# Patient Record
Sex: Male | Born: 1935 | Race: White | Hispanic: No | Marital: Married | Smoking: Never smoker
Health system: Southern US, Community
[De-identification: ages and names within clinical notes are randomized; demographics above are authoritative.]

## PROBLEM LIST (undated history)

## (undated) DIAGNOSIS — I10 Essential (primary) hypertension: Secondary | ICD-10-CM

## (undated) DIAGNOSIS — N400 Enlarged prostate without lower urinary tract symptoms: Secondary | ICD-10-CM

## (undated) DIAGNOSIS — I2510 Atherosclerotic heart disease of native coronary artery without angina pectoris: Secondary | ICD-10-CM

## (undated) DIAGNOSIS — I2699 Other pulmonary embolism without acute cor pulmonale: Secondary | ICD-10-CM

## (undated) DIAGNOSIS — M353 Polymyalgia rheumatica: Secondary | ICD-10-CM

## (undated) DIAGNOSIS — R9409 Abnormal results of other function studies of central nervous system: Secondary | ICD-10-CM

## (undated) DIAGNOSIS — L719 Rosacea, unspecified: Secondary | ICD-10-CM

## (undated) HISTORY — PX: APPENDECTOMY: SHX54

## (undated) HISTORY — DX: Other pulmonary embolism without acute cor pulmonale: I26.99

---

## 2005-06-16 ENCOUNTER — Ambulatory Visit: Payer: Self-pay | Admitting: Unknown Physician Specialty

## 2010-02-08 ENCOUNTER — Inpatient Hospital Stay: Payer: Self-pay | Admitting: Internal Medicine

## 2010-03-05 ENCOUNTER — Encounter: Payer: Self-pay | Admitting: Internal Medicine

## 2010-03-22 ENCOUNTER — Encounter: Payer: Self-pay | Admitting: Internal Medicine

## 2010-04-22 ENCOUNTER — Encounter: Payer: Self-pay | Admitting: Internal Medicine

## 2013-06-11 DIAGNOSIS — D239 Other benign neoplasm of skin, unspecified: Secondary | ICD-10-CM

## 2013-06-11 HISTORY — DX: Other benign neoplasm of skin, unspecified: D23.9

## 2015-06-19 ENCOUNTER — Inpatient Hospital Stay: Payer: Medicare Other

## 2015-06-19 ENCOUNTER — Emergency Department: Payer: Medicare Other

## 2015-06-19 ENCOUNTER — Inpatient Hospital Stay
Admission: EM | Admit: 2015-06-19 | Discharge: 2015-06-24 | DRG: 175 | Disposition: A | Discharge status: Home / Self Care | Payer: Medicare Other | Attending: Internal Medicine | Admitting: Internal Medicine

## 2015-06-19 ENCOUNTER — Inpatient Hospital Stay
Admit: 2015-06-19 | Discharge: 2015-06-19 | Disposition: A | Discharge status: Home / Self Care | Payer: Medicare Other | Attending: Internal Medicine | Admitting: Internal Medicine

## 2015-06-19 ENCOUNTER — Encounter: Payer: Self-pay | Admitting: *Deleted

## 2015-06-19 DIAGNOSIS — I2699 Other pulmonary embolism without acute cor pulmonale: Secondary | ICD-10-CM | POA: Diagnosis present

## 2015-06-19 DIAGNOSIS — I824Z1 Acute embolism and thrombosis of unspecified deep veins of right distal lower extremity: Secondary | ICD-10-CM | POA: Diagnosis present

## 2015-06-19 DIAGNOSIS — T380X5A Adverse effect of glucocorticoids and synthetic analogues, initial encounter: Secondary | ICD-10-CM | POA: Diagnosis present

## 2015-06-19 DIAGNOSIS — Z79899 Other long term (current) drug therapy: Secondary | ICD-10-CM | POA: Diagnosis not present

## 2015-06-19 DIAGNOSIS — N400 Enlarged prostate without lower urinary tract symptoms: Secondary | ICD-10-CM | POA: Diagnosis present

## 2015-06-19 DIAGNOSIS — I82409 Acute embolism and thrombosis of unspecified deep veins of unspecified lower extremity: Secondary | ICD-10-CM | POA: Diagnosis not present

## 2015-06-19 DIAGNOSIS — Z789 Other specified health status: Secondary | ICD-10-CM

## 2015-06-19 DIAGNOSIS — I517 Cardiomegaly: Secondary | ICD-10-CM | POA: Diagnosis present

## 2015-06-19 DIAGNOSIS — Z7982 Long term (current) use of aspirin: Secondary | ICD-10-CM | POA: Diagnosis not present

## 2015-06-19 DIAGNOSIS — I10 Essential (primary) hypertension: Secondary | ICD-10-CM | POA: Diagnosis present

## 2015-06-19 DIAGNOSIS — J189 Pneumonia, unspecified organism: Secondary | ICD-10-CM | POA: Diagnosis present

## 2015-06-19 DIAGNOSIS — Z888 Allergy status to other drugs, medicaments and biological substances status: Secondary | ICD-10-CM

## 2015-06-19 DIAGNOSIS — D72829 Elevated white blood cell count, unspecified: Secondary | ICD-10-CM | POA: Diagnosis present

## 2015-06-19 DIAGNOSIS — A419 Sepsis, unspecified organism: Secondary | ICD-10-CM

## 2015-06-19 DIAGNOSIS — R0902 Hypoxemia: Secondary | ICD-10-CM | POA: Diagnosis present

## 2015-06-19 DIAGNOSIS — R0602 Shortness of breath: Secondary | ICD-10-CM | POA: Diagnosis not present

## 2015-06-19 DIAGNOSIS — I2510 Atherosclerotic heart disease of native coronary artery without angina pectoris: Secondary | ICD-10-CM | POA: Diagnosis present

## 2015-06-19 DIAGNOSIS — L719 Rosacea, unspecified: Secondary | ICD-10-CM | POA: Diagnosis present

## 2015-06-19 DIAGNOSIS — R9409 Abnormal results of other function studies of central nervous system: Secondary | ICD-10-CM | POA: Insufficient documentation

## 2015-06-19 DIAGNOSIS — M353 Polymyalgia rheumatica: Secondary | ICD-10-CM | POA: Diagnosis present

## 2015-06-19 DIAGNOSIS — R5383 Other fatigue: Secondary | ICD-10-CM | POA: Diagnosis not present

## 2015-06-19 HISTORY — DX: Benign prostatic hyperplasia without lower urinary tract symptoms: N40.0

## 2015-06-19 HISTORY — DX: Polymyalgia rheumatica: M35.3

## 2015-06-19 HISTORY — DX: Atherosclerotic heart disease of native coronary artery without angina pectoris: I25.10

## 2015-06-19 HISTORY — DX: Abnormal results of other function studies of central nervous system: R94.09

## 2015-06-19 HISTORY — DX: Rosacea, unspecified: L71.9

## 2015-06-19 HISTORY — DX: Essential (primary) hypertension: I10

## 2015-06-19 LAB — BASIC METABOLIC PANEL
Anion gap: 10 (ref 5–15)
BUN: 17 mg/dL (ref 6–20)
CALCIUM: 8.7 mg/dL — AB (ref 8.9–10.3)
CO2: 25 mmol/L (ref 22–32)
Chloride: 101 mmol/L (ref 101–111)
Creatinine, Ser: 1.2 mg/dL (ref 0.61–1.24)
GFR calc Af Amer: >60 mL/min (ref >60)
GFR calc non Af Amer: 57 mL/min — ABNORMAL LOW (ref >60)
Glucose, Bld: 121 mg/dL — ABNORMAL HIGH (ref 65–99)
POTASSIUM: 3.9 mmol/L (ref 3.5–5.1)
Sodium: 136 mmol/L (ref 135–145)

## 2015-06-19 LAB — CBC
HCT: 44.9 % (ref 40.0–52.0)
Hemoglobin: 14.6 g/dL (ref 13.0–18.0)
MCH: 29.8 pg (ref 26.0–34.0)
MCHC: 32.5 g/dL (ref 32.0–36.0)
MCV: 91.7 fL (ref 80.0–100.0)
Platelets: 180 10*3/uL (ref 150–440)
RBC: 4.9 MIL/uL (ref 4.40–5.90)
RDW: 13.5 % (ref 11.5–14.5)
WBC: 15.8 10*3/uL — AB (ref 3.8–10.6)

## 2015-06-19 LAB — BRAIN NATRIURETIC PEPTIDE: B NATRIURETIC PEPTIDE 5: 168 pg/mL — AB (ref 0.0–100.0)

## 2015-06-19 LAB — LACTIC ACID, PLASMA: LACTIC ACID, VENOUS: 0.8 mmol/L (ref 0.5–2.0)

## 2015-06-19 MED ORDER — BISACODYL 5 MG PO TBEC: 5.0000 mg | DELAYED_RELEASE_TABLET | Freq: Every day | ORAL | Status: DC | PRN

## 2015-06-19 MED ORDER — PIPERACILLIN-TAZOBACTAM 3.375 G IVPB
3.3750 g | Freq: Three times a day (TID) | INTRAVENOUS | Status: DC
Start: 2015-06-19 — End: 2015-06-19
  Filled 2015-06-19 (×4): qty 50

## 2015-06-19 MED ORDER — ASPIRIN EC 81 MG PO TBEC
81.0000 mg | DELAYED_RELEASE_TABLET | Freq: Every day | ORAL | Status: DC
  Administered 2015-06-19 – 2015-06-24 (×6): 81 mg via ORAL
  Filled 2015-06-19 (×6): qty 1

## 2015-06-19 MED ORDER — AZITHROMYCIN 250 MG PO TABS
500.0000 mg | ORAL_TABLET | Freq: Every day | ORAL | Status: DC
  Administered 2015-06-19 – 2015-06-24 (×6): 500 mg via ORAL
  Filled 2015-06-19 (×6): qty 2

## 2015-06-19 MED ORDER — PIPERACILLIN-TAZOBACTAM 3.375 G IVPB 30 MIN
3.3750 g | Freq: Once | INTRAVENOUS | Status: DC
  Administered 2015-06-19: 3.375 g via INTRAVENOUS

## 2015-06-19 MED ORDER — DOXYCYCLINE HYCLATE 100 MG PO TABS
50.0000 mg | ORAL_TABLET | Freq: Every day | ORAL | Status: DC
  Administered 2015-06-19 – 2015-06-23 (×5): 50 mg via ORAL
  Filled 2015-06-19 (×5): qty 1

## 2015-06-19 MED ORDER — DOXAZOSIN MESYLATE 2 MG PO TABS
2.0000 mg | ORAL_TABLET | Freq: Every day | ORAL | Status: DC
  Administered 2015-06-19 – 2015-06-23 (×5): 2 mg via ORAL
  Filled 2015-06-19 (×5): qty 1

## 2015-06-19 MED ORDER — ACETAMINOPHEN 650 MG RE SUPP: 650.0000 mg | Freq: Four times a day (QID) | RECTAL | Status: DC | PRN

## 2015-06-19 MED ORDER — GUAIFENESIN-DM 100-10 MG/5ML PO SYRP
5.0000 mL | ORAL_SOLUTION | ORAL | Status: DC | PRN
  Administered 2015-06-21 – 2015-06-22 (×6): 5 mL via ORAL
  Filled 2015-06-19 (×6): qty 5

## 2015-06-19 MED ORDER — PIPERACILLIN-TAZOBACTAM 3.375 G IVPB: 3.3750 g | Freq: Once | INTRAVENOUS | Status: DC

## 2015-06-19 MED ORDER — DOXYCYCLINE HYCLATE 50 MG PO CAPS
50.0000 mg | ORAL_CAPSULE | Freq: Every day | ORAL | Status: DC
  Filled 2015-06-19: qty 1

## 2015-06-19 MED ORDER — PREDNISONE 2.5 MG PO TABS
2.5000 mg | ORAL_TABLET | Freq: Two times a day (BID) | ORAL | Status: DC
  Filled 2015-06-19: qty 2

## 2015-06-19 MED ORDER — LEVOFLOXACIN IN D5W 750 MG/150ML IV SOLN
750.0000 mg | Freq: Once | INTRAVENOUS | Status: DC
  Administered 2015-06-19: 750 mg via INTRAVENOUS
  Filled 2015-06-19: qty 150

## 2015-06-19 MED ORDER — SIMVASTATIN 40 MG PO TABS
40.0000 mg | ORAL_TABLET | Freq: Every day | ORAL | Status: DC
  Administered 2015-06-19 – 2015-06-23 (×5): 40 mg via ORAL
  Filled 2015-06-19 (×5): qty 1

## 2015-06-19 MED ORDER — VANCOMYCIN HCL 10 G IV SOLR: 1500.0000 mg | Freq: Once | INTRAVENOUS | Status: DC

## 2015-06-19 MED ORDER — ONDANSETRON HCL 4 MG PO TABS: 4.0000 mg | ORAL_TABLET | Freq: Four times a day (QID) | ORAL | Status: DC | PRN

## 2015-06-19 MED ORDER — OMEGA-3-ACID ETHYL ESTERS 1 G PO CAPS
1.0000 g | ORAL_CAPSULE | Freq: Two times a day (BID) | ORAL | Status: DC
  Administered 2015-06-19 – 2015-06-24 (×11): 1 g via ORAL
  Filled 2015-06-19 (×11): qty 1

## 2015-06-19 MED ORDER — SODIUM CHLORIDE 0.9 % IV BOLUS (SEPSIS)
1000.0000 mL | Freq: Once | INTRAVENOUS | Status: AC
  Administered 2015-06-19: 1000 mL via INTRAVENOUS

## 2015-06-19 MED ORDER — ACETAMINOPHEN 325 MG PO TABS
650.0000 mg | ORAL_TABLET | Freq: Four times a day (QID) | ORAL | Status: DC | PRN
  Administered 2015-06-20: 650 mg via ORAL
  Filled 2015-06-19: qty 2

## 2015-06-19 MED ORDER — ENOXAPARIN SODIUM 40 MG/0.4ML ~~LOC~~ SOLN: 40.0000 mg | SUBCUTANEOUS | Status: DC

## 2015-06-19 MED ORDER — ENOXAPARIN SODIUM 100 MG/ML ~~LOC~~ SOLN
1.0000 mg/kg | Freq: Two times a day (BID) | SUBCUTANEOUS | Status: DC
  Administered 2015-06-19 – 2015-06-21 (×4): 95 mg via SUBCUTANEOUS
  Filled 2015-06-19 (×8): qty 1

## 2015-06-19 MED ORDER — PREDNISONE 10 MG PO TABS
5.0000 mg | ORAL_TABLET | Freq: Every day | ORAL | Status: DC
  Administered 2015-06-20 – 2015-06-24 (×5): 5 mg via ORAL
  Filled 2015-06-19: qty 2
  Filled 2015-06-19 (×5): qty 1

## 2015-06-19 MED ORDER — PREDNISONE 2.5 MG PO TABS
2.5000 mg | ORAL_TABLET | Freq: Every evening | ORAL | Status: DC
Start: 2015-06-19 — End: 2015-06-24
  Administered 2015-06-20 – 2015-06-23 (×4): 2.5 mg via ORAL
  Filled 2015-06-19 (×7): qty 1

## 2015-06-19 MED ORDER — IOHEXOL 350 MG/ML SOLN
100.0000 mL | Freq: Once | INTRAVENOUS | Status: AC | PRN
  Administered 2015-06-19: 100 mL via INTRAVENOUS

## 2015-06-19 MED ORDER — ONDANSETRON HCL 4 MG/2ML IJ SOLN: 4.0000 mg | Freq: Four times a day (QID) | INTRAMUSCULAR | Status: DC | PRN

## 2015-06-19 MED ORDER — IPRATROPIUM-ALBUTEROL 0.5-2.5 (3) MG/3ML IN SOLN: 3.0000 mL | RESPIRATORY_TRACT | Status: DC | PRN

## 2015-06-19 NOTE — Progress Notes (Signed)
ANTICOAGULATION CONSULT NOTE - Initial Consult  Pharmacy Consult for enoxaparin Indication: pulmonary embolus  Allergies  Allergen Reactions  . Beta Adrenergic Blockers Other (See Comments)    Reaction:  Drops pts pulse rate  . Cozaar [Losartan Potassium] Other (See Comments)    Reaction:  Cramps   . Indomethacin Other (See Comments)    Reaction:  Bleeding     Patient Measurements: Height: 5\' 8"  (172.7 cm) Weight: 208 lb (94.348 kg) IBW/kg (Calculated) : 68.4   Vital Signs: Temp: 99.4 F (37.4 C) (07/28 1210) Temp Source: Oral (07/28 1210) BP: 128/82 mmHg (07/28 1430) Pulse Rate: 110 (07/28 1430)  Labs:  Recent Labs  06/19/15 1235  HGB 14.6  HCT 44.9  PLT 180  CREATININE 1.20    Estimated Creatinine Clearance: 57.5 mL/min (by C-G formula based on Cr of 1.2).   Medical History: Past Medical History  Diagnosis Date  . Coronary artery disease   . Hypertension   . Other abnormality of brain or central nervous system function study     poly \\rheumatic  myalgia  . BPH (benign prostatic hyperplasia)   . Polymyalgia rheumatica   . Rosacea      Assessment: 79 yo male with PE starting on enoxaparin Hgb 14.6, Plt 180  Goal of Therapy:  Anti-Xa level 0.6-1 units/ml 4hrs after LMWH dose given Monitor platelets by anticoagulation protocol: Yes   Plan:  Will continue MD orders for enoxaparin 95 mg SQ q12h (1 mg /kg) Will need to continue to monitor SCr, Hgb and Plt  Rayna Sexton L 06/19/2015,3:17 PM

## 2015-06-19 NOTE — Progress Notes (Signed)
Spoke with Dr Lavetta Nielsen. Reported Tele- ST 120s-130. Pulse ox 87-88% on BNC 5L.Ok to place on 6 L.

## 2015-06-19 NOTE — Progress Notes (Signed)
ANTIBIOTIC CONSULT NOTE - INITIAL  Pharmacy Consult for ZOSYN Indication: pneumonia/?HCAP  Allergies  Allergen Reactions  . Beta Adrenergic Blockers Other (See Comments)    Reaction:  Drops pts pulse rate  . Cozaar [Losartan Potassium] Other (See Comments)    Reaction:  Cramps   . Indomethacin Other (See Comments)    Reaction:  Bleeding     Patient Measurements: Height: 5\' 8"  (172.7 cm) Weight: 208 lb (94.348 kg) IBW/kg (Calculated) : 68.4 Adjusted Body Weight:   Vital Signs: Temp: 99.4 F (37.4 C) (07/28 1210) Temp Source: Oral (07/28 1210) BP: 124/89 mmHg (07/28 1400) Pulse Rate: 104 (07/28 1400) Intake/Output from previous day:   Intake/Output from this shift:    Labs:  Recent Labs  06/19/15 1235  WBC 15.8*  HGB 14.6  PLT 180  CREATININE 1.20   Estimated Creatinine Clearance: 57.5 mL/min (by C-G formula based on Cr of 1.2).   Microbiology: No results found for this or any previous visit (from the past 720 hour(s)).  Medical History: Past Medical History  Diagnosis Date  . Coronary artery disease   . Hypertension   . Other abnormality of brain or central nervous system function study     poly \\rheumatic  myalgia  . BPH (benign prostatic hyperplasia)   . Polymyalgia rheumatica   . Rosacea     Medications:  Scheduled:  . aspirin EC  81 mg Oral Daily  . azithromycin  500 mg Oral Daily  . enoxaparin (LOVENOX) injection  40 mg Subcutaneous Q24H   Anti-infectives    Start     Dose/Rate Route Frequency Ordered Stop   06/19/15 1445  piperacillin-tazobactam (ZOSYN) IVPB 3.375 g     3.375 g 12.5 mL/hr over 240 Minutes Intravenous 3 times per day 06/19/15 1436     06/19/15 1430  azithromycin (ZITHROMAX) tablet 500 mg     500 mg Oral Daily 06/19/15 1415     06/19/15 1300  levofloxacin (LEVAQUIN) IVPB 750 mg  Status:  Discontinued     750 mg 100 mL/hr over 90 Minutes Intravenous  Once 06/19/15 1258 06/19/15 1415   06/19/15 1300  vancomycin (VANCOCIN)  1,500 mg in sodium chloride 0.9 % 500 mL IVPB  Status:  Discontinued     1,500 mg 250 mL/hr over 120 Minutes Intravenous  Once 06/19/15 1258 06/19/15 1415   06/19/15 1300  piperacillin-tazobactam (ZOSYN) IVPB 3.375 g     3.375 g 12.5 mL/hr over 240 Minutes Intravenous  Once 06/19/15 1258       Assessment: Patient presents with outpatient treatment failure of community acquired pneumonia with tachycardia and acute hypoxic respiratory failure. Patient has Sirs criteria andpossible sepsis with healthcare associated pneumonia.  IV Zosyn and vancomycin and Levaquin ordered initially.  Plan:  Will start Zosyn EI 3.375gm IV q8h.  Chinita Greenland PharmD Clinical Pharmacist 06/19/2015

## 2015-06-19 NOTE — ED Notes (Signed)
Seen at clinic last week, placed on antibiotics and couigh meds  Dx with upper resp infection, states sob is worse

## 2015-06-19 NOTE — Progress Notes (Signed)
*  PRELIMINARY RESULTS* Echocardiogram 2D Echocardiogram has been performed.  Isaac Gould Stills 06/19/2015, 5:22 PM

## 2015-06-19 NOTE — H&P (Signed)
[place] [facility] [place] NAME: Isaac Gould    MR#:  [id number]  DATE OF BIRTH:  1936/06/03  DATE OF ADMISSION:  06/19/2015  PRIMARY CARE PHYSICIAN: No primary care provider on file.   REQUESTING/REFERRING PHYSICIAN: Dr. Joni Fears - ED  CHIEF COMPLAINT:   Chief Complaint  Patient presents with  . Shortness of Breath    HISTORY OF PRESENT ILLNESS:  Isaac Gould  is a 79 y.o. male with a known history of hypertension, CAD, rosacea on chronic doxycycline presents to the emergency room with worsening shortness of breath and cough with green sputum. Patient was seen by his primary care physician and treated for mild pneumonia with cefuroxime times as outpatient. Patient has taken his antibiotics for 8 days today and with worsening symptoms present to the emergency room. His oxygen saturations were noted to be 87% on room air and a chest x-ray showing bilateral mild pneumonitis with a small left pleural effusion. A CT scan of the chest has been ordered and is pending. Being admitted as inpatient for IV antibiotics for failed outpatient therapy of pneumonia. No chest pain, fever, nausea, vomiting, abdominal pain, dysuria, diarrhea. No orthopnea. No edema. No sick contacts.  PAST MEDICAL HISTORY:   Past Medical History  Diagnosis Date  . Coronary artery disease   . Hypertension   . Other abnormality of brain or central nervous system function study     poly \\rheumatic  myalgia  . BPH (benign prostatic hyperplasia)   . Polymyalgia rheumatica   . Rosacea     PAST SURGICAL HISTORY:   Past Surgical History  Procedure Laterality Date  . Appendectomy      SOCIAL HISTORY:   History  Substance Use Topics  . Smoking status: Never Smoker   . Smokeless tobacco: Not on file  . Alcohol Use: No    FAMILY HISTORY:  History reviewed. No pertinent family history.  DRUG ALLERGIES:   Allergies  Allergen Reactions  . Beta Adrenergic Blockers      REVIEW OF SYSTEMS:   Review of Systems  Constitutional: Positive for chills and malaise/fatigue. Negative for fever and weight loss.  HENT: Positive for congestion. Negative for hearing loss and nosebleeds.   Eyes: Negative for blurred vision, double vision and pain.  Respiratory: Positive for cough, sputum production and shortness of breath. Negative for hemoptysis and wheezing.   Cardiovascular: Negative for chest pain, palpitations, orthopnea and leg swelling.  Gastrointestinal: Negative for nausea, vomiting, abdominal pain, diarrhea and constipation.  Genitourinary: Negative for dysuria and hematuria.  Musculoskeletal: Negative for myalgias, back pain and falls.  Skin: Negative for rash.  Neurological: Positive for weakness. Negative for dizziness, tremors, sensory change, speech change, focal weakness, seizures and headaches.  Endo/Heme/Allergies: Does not bruise/bleed easily.  Psychiatric/Behavioral: Negative for depression and memory loss. The patient is not nervous/anxious.     MEDICATIONS AT HOME:   Prior to Admission medications   Not on File      aspirin 81 MG EC tablet Take 81 mg by mouth once daily.     Active  omega-3 fatty acids/fish oil 340-1,000 mg capsule Take 1 capsule by mouth 2 (two) times daily.     Active  MULTIVITAMIN/IRON/FOLIC ACID (CENTRUM COMPLETE ORAL) Take 1 tablet by mouth once daily.     Active  doxycycline (MONODOX) 50 MG capsule Take 50 mg by mouth once daily.     Active  doxazosin (CARDURA) 2 MG tablet Take 1 tablet (2 mg  total) by mouth nightly. 90 tablet  3 05/07/2015  Active  candesartan (ATACAND) 4 MG tablet Take 1 tablet (4 mg total) by mouth once daily. 90 tablet  3 05/07/2015  Active  simvastatin (ZOCOR) 40 MG tablet Take 1 tablet (40 mg total) by mouth nightly. 90 tablet  3 05/07/2015  Active  hydrocodone-homatropine (HYCODAN) 5-1.5 mg/5 mL syrup Take 5 mLs by mouth every 6 (six) hours as needed for Cough. 120 mL  0  06/11/2015  Active  cefUROXime (CEFTIN) 250 MG tablet Take 1 tablet (250 mg total) by mouth 2 (two) times daily. 20 tablet  0 06/11/2015  Active     VITAL SIGNS:  Blood pressure 127/111, pulse 105, temperature 99.4 F (37.4 C), temperature source Oral, resp. rate 24, height 5\' 8"  (1.727 m), weight 94.348 kg (208 lb), SpO2 93 %.  PHYSICAL EXAMINATION:  Physical Exam  GENERAL:  79 y.o.-year-old patient lying in the bed with no acute distress.  EYES: Pupils equal, round, reactive to light and accommodation. No scleral icterus. Extraocular muscles intact.  HEENT: Head atraumatic, normocephalic. Oropharynx and nasopharynx clear. No oropharyngeal erythema, moist oral mucosa  NECK:  Supple, no jugular venous distention. No thyroid enlargement, no tenderness.  LUNGS:  no wheezing, rales. No use of accessory muscles of respiration. Right ronchi. CARDIOVASCULAR: S1, S2 normal. No murmurs, rubs, or gallops.  ABDOMEN: Soft, nontender, nondistended. Bowel sounds present. No organomegaly or mass.  EXTREMITIES: No pedal edema, cyanosis, or clubbing. + 2 pedal & radial pulses b/l.   NEUROLOGIC: Cranial nerves II through XII are intact. No focal Motor or sensory deficits appreciated b/l PSYCHIATRIC: The patient is alert and oriented x 3. Good affect.  SKIN: No obvious rash, lesion, or ulcer.   LABORATORY PANEL:   CBC  Recent Labs Lab 06/19/15 1235  WBC 15.8*  HGB 14.6  HCT 44.9  PLT 180   ------------------------------------------------------------------------------------------------------------------  Chemistries   Recent Labs Lab 06/19/15 1235  NA 136  K 3.9  CL 101  CO2 25  GLUCOSE 121*  BUN 17  CREATININE 1.20  CALCIUM 8.7*   ------------------------------------------------------------------------------------------------------------------  Cardiac Enzymes No results for input(s): TROPONINI in the last 168  hours. ------------------------------------------------------------------------------------------------------------------  RADIOLOGY:  Dg Chest 2 View  06/19/2015   CLINICAL DATA:  Coughing for 2 weeks, productive sputum.  EXAM: CHEST  2 VIEW  COMPARISON:  Chest x-ray dated 02/08/2010.  FINDINGS: Cardiomegaly is stable. Overall cardiomediastinal silhouette is unchanged in size or configuration. Suspect age-related aortic ectasia. There is mild scarring/ fibrosis within each lung. Also noted is central pulmonary vascular congestion and probable mild bilateral interstitial edema which appears similar to the previous exam. There is a small left pleural effusion which appears to be new.  Degenerative changes and ankylosis are seen throughout the scoliotic thoracic spine. No acute osseous abnormality.  IMPRESSION: 1. Cardiomegaly, stable. 2. Central pulmonary vascular congestion and mild bilateral interstitial edema, likely on a background of mild chronic scarring/fibrosis, suggests mild volume overload/ congestive heart failure. Appearance is stable compared to a previous chest x-ray dated 02/08/2010 indicating a chronic versus recurrent mild congestive heart failure. 3. Small left pleural effusion.   Electronically Signed   By: Franki Cabot M.D.   On: 06/19/2015 13:41     IMPRESSION AND PLAN:   * Bilateral community-acquired pneumonia with acute respiratory failure and sepsis. Failed outpatient antibiotics therapy. Start IV Zosyn and azithromycin. Send for sputum and blood cultures. CT scan of the chest is pending. Lactic acid pending. IV fluids.  Oxygen to keep saturations over 90%. DuoNeb when necessary.  * Coronary artery disease stable  * Hypertension Continue home medications.  * DVT prophylaxis with Lovenox.   All the records are reviewed and case discussed with ED provider. Management plans discussed with the patient, family and they are in agreement.  CODE STATUS: FULL  TOTAL TIME  TAKING CARE OF THIS PATIENT: 40 minutes.    Hillary Bow R M.D on 06/19/2015 at 2:16 PM  Between 7am to 6pm - Pager - [phone]  After 6pm go to [email or web address] - password [email or web address]  [facility]  Office  [phone]  CC: Primary care physician; No primary care provider on file.

## 2015-06-19 NOTE — Progress Notes (Signed)
After patient was admitted his CT scan of the chest showed bilateral submassive pulmonary embolism with right heart strain. Case was discussed with pulmonary critical care at [facility] for possible TPA or intra-arterial thrombolysis and considering patient's hemodynamics are stable this was not advised.  We'll admit patient here at [facility]. Telemetry monitoring. Lovenox therapeutic dose 2 times a day. Check echocardiogram. We will also order lower extremity Dopplers. This is unprovoked pulmonary embolism and he will need hematology follow-up as outpatient for hypercoagulable workup.  BP - 117/73 and HR 110 Saturations - 94% on 3 L O2

## 2015-06-19 NOTE — ED Provider Notes (Addendum)
[facility] Emergency Department Provider Note  ____________________________________________  Time seen: 12:30 PM  I have reviewed the triage vital signs and the nursing notes.   HISTORY  Chief Complaint Shortness of Breath    HPI Isaac Gould is a 79 y.o. male who presents with shortness of breath for the past 10 days. He is also having it cough that is productive of thick yellow sputum. He notes that the shortness of breath is really worse with walking.  Patient also reports fever and chills. He has been taking Ceftin as prescribed by primary care for the past week without improvement. Denies syncope. Denies chest pain except with coughing.     Past Medical History  Diagnosis Date  . Coronary artery disease   . Hypertension   . Other abnormality of brain or central nervous system function study     poly \\rheumatic  myalgia    Patient Active Problem List   Diagnosis Date Noted  . Pneumonia 06/19/2015  . Other abnormality of brain or central nervous system function study     Past Surgical History  Procedure Laterality Date  . Appendectomy      No current outpatient prescriptions on file.  Allergies Beta adrenergic blockers  History reviewed. No pertinent family history.  Social History History  Substance Use Topics  . Smoking status: Never Smoker   . Smokeless tobacco: Not on file  . Alcohol Use: No    Review of Systems  Constitutional: No fever or chills. No weight changes Eyes:No blurry vision or double vision.  ENT: No sore throat. Cardiovascular: Chest pain with coughing as above. Respiratory: Positive shortness of breath and productive cough Gastrointestinal: Negative for abdominal pain, vomiting and diarrhea.  No BRBPR or melena. Genitourinary: Negative for dysuria, urinary retention, bloody urine, or difficulty urinating. Musculoskeletal: Negative for back pain. No joint swelling or pain. Skin: Negative for  rash. Neurological: Negative for headaches, focal weakness or numbness. Psychiatric:No anxiety or depression.   Endocrine:No hot/cold intolerance, changes in energy, or sleep difficulty.  10-point ROS otherwise negative.  ____________________________________________   PHYSICAL EXAM:  VITAL SIGNS: ED Triage Vitals  Enc Vitals Group     BP 06/19/15 1210 125/78 mmHg     Pulse Rate 06/19/15 1210 110     Resp 06/19/15 1210 24     Temp 06/19/15 1210 99.4 F (37.4 C)     Temp Source 06/19/15 1210 Oral     SpO2 06/19/15 1210 90 %     Weight 06/19/15 1210 208 lb (94.348 kg)     Height 06/19/15 1210 5\' 8"  (1.727 m)     Head Cir --      Peak Flow --      Pain Score 06/19/15 1211 8     Pain Loc --      Pain Edu? --      Excl. in [place]? --   87% on room air, improved to 90-92% on 3 L nasal cannula.   Constitutional: Alert and oriented. Mild distress. Eyes: No scleral icterus. No conjunctival pallor. PERRL. EOMI ENT   Head: Normocephalic and atraumatic.   Nose: No congestion/rhinnorhea. No septal hematoma   Mouth/Throat: MMM, no pharyngeal erythema. No peritonsillar mass. No uvula shift.   Neck: No stridor. No SubQ emphysema. No meningismus. Hematological/Lymphatic/Immunilogical: No cervical lymphadenopathy. Cardiovascular: Tachycardia heart rate 110-120. Normal and symmetric distal pulses are present in all extremities. No murmurs, rubs, or gallops. Respiratory: Coarse crackles at right base. Good air entry in all  other fields. No wheezing.. Gastrointestinal: Soft and nontender. No distention. There is no CVA tenderness.  No rebound, rigidity, or guarding. Genitourinary: deferred Musculoskeletal: Nontender with normal range of motion in all extremities. No joint effusions.  No lower extremity tenderness.  No edema. Neurologic:   Normal speech and language.  CN 2-10 normal. Motor grossly intact. No pronator drift.  Normal gait. No gross focal neurologic deficits are  appreciated.  Skin:  Skin is warm, dry and intact. No rash noted.  No petechiae, purpura, or bullae. Psychiatric: Mood and affect are normal. Speech and behavior are normal. Patient exhibits appropriate insight and judgment.  ____________________________________________    LABS (pertinent positives/negatives) (all labs ordered are listed, but only abnormal results are displayed) Labs Reviewed  CBC - Abnormal; Notable for the following:    WBC 15.8 (*)    All other components within normal limits  BASIC METABOLIC PANEL - Abnormal; Notable for the following:    Glucose, Bld 121 (*)    Calcium 8.7 (*)    GFR calc non Af Amer 57 (*)    All other components within normal limits  CULTURE, BLOOD (ROUTINE X 2)  CULTURE, BLOOD (ROUTINE X 2)  CULTURE, EXPECTORATED SPUTUM-ASSESSMENT  LACTIC ACID, PLASMA  LACTIC ACID, PLASMA  BRAIN NATRIURETIC PEPTIDE   ____________________________________________   EKG  Interpreted by me Sinus tachycardia rate 105, normal axis intervals QRS and ST segments and T waves.  ____________________________________________    RADIOLOGY  Chest x-ray shows stable cardiomegaly and pulmonary vascular congestion  ____________________________________________   PROCEDURES CRITICAL CARE Performed by: Joni Fears, Revan Gendron   Total critical care time: 35 minutes  Critical care time was exclusive of separately billable procedures and treating other patients.  Critical care was necessary to treat or prevent imminent or life-threatening deterioration.  Critical care was time spent personally by me on the following activities: development of treatment plan with patient and/or surrogate as well as nursing, discussions with consultants, evaluation of patient's response to treatment, examination of patient, obtaining history from patient or surrogate, ordering and performing treatments and interventions, ordering and review of laboratory studies, ordering and review of  radiographic studies, pulse oximetry and re-evaluation of patient's condition.  ____________________________________________   INITIAL IMPRESSION / ASSESSMENT AND PLAN / ED COURSE  Pertinent labs & imaging results that were available during my care of the patient were reviewed by me and considered in my medical decision making (see chart for details).  Patient presents with outpatient treatment failure of committing acquired pneumonia with tachycardia and acute hypoxic respiratory failure. At this point he now has Sirs criteria and we will treat him as sepsis with healthcare associated pneumonia. We'll give IV fluids while checking labs and a chest x-ray. If x-ray does not show a definite pneumonia I'll proceed with CT angiogram of the chest to ensure that this is not infected PE masquerading as pneumonia. IV Zosyn and vancomycin and Levaquin ordered initially. ----------------------------------------- 2:25 PM on 06/19/2015 ----------------------------------------- Tachycardia improving with IV fluids. Blood pressure remained stable. Oxygenation adequate on nasal cannula. Awaiting CT angiogram of the chest. Discussed with hospitalist Dr. Darvin Neighbours who will arrange for admission for pneumonia. ----------------------------------------- 2:58 PM on 06/19/2015 -----------------------------------------  After initial evaluation by hospitalist and plan for admission, I received a follow-up phone call from radiology indicated and the patient has diffuse pulmonary emboli including a nonobstructive saddle embolus with a total clot burden that is causing right heart strain consistent with subcutaneous mass of pulmonary embolus. [facility] is able to  do catheter directed intra-arterial thrombolysis to relieve this and decrease the patient's long-term comorbidities such as pollen or hypertension, so I discussed this possibility with the patient who is agreeable to transfer to [facility] for evaluation for this  treatment.  ----------------------------------------- 3:08 PM on 06/19/2015 -----------------------------------------  Discussed with Pohlmann or critical care at [facility] who notes that because the patient has not had any episodes of hypotension, they do not think the risk-benefit balance is favorable in this case for the use of thrombolysis. They recommend usual care, so we'll keep the patient here at [facility] for anticoagulation and monitoring. ____________________________________________   FINAL CLINICAL IMPRESSION(S) / ED DIAGNOSES  Final diagnoses:  Failure of outpatient treatment  Sepsis, due to unspecified organism  Pneumonia, organism unspecified      Carrie Mew, MD 06/19/15 [place], MD 06/19/15 [phone]

## 2015-06-20 DIAGNOSIS — I2699 Other pulmonary embolism without acute cor pulmonale: Principal | ICD-10-CM

## 2015-06-20 DIAGNOSIS — I2510 Atherosclerotic heart disease of native coronary artery without angina pectoris: Secondary | ICD-10-CM

## 2015-06-20 DIAGNOSIS — I10 Essential (primary) hypertension: Secondary | ICD-10-CM

## 2015-06-20 DIAGNOSIS — M791 Myalgia: Secondary | ICD-10-CM

## 2015-06-20 DIAGNOSIS — M353 Polymyalgia rheumatica: Secondary | ICD-10-CM

## 2015-06-20 DIAGNOSIS — I82409 Acute embolism and thrombosis of unspecified deep veins of unspecified lower extremity: Secondary | ICD-10-CM

## 2015-06-20 DIAGNOSIS — R0602 Shortness of breath: Secondary | ICD-10-CM

## 2015-06-20 DIAGNOSIS — R5383 Other fatigue: Secondary | ICD-10-CM

## 2015-06-20 DIAGNOSIS — R531 Weakness: Secondary | ICD-10-CM

## 2015-06-20 DIAGNOSIS — N400 Enlarged prostate without lower urinary tract symptoms: Secondary | ICD-10-CM

## 2015-06-20 DIAGNOSIS — L719 Rosacea, unspecified: Secondary | ICD-10-CM

## 2015-06-20 MED ORDER — PNEUMOCOCCAL VAC POLYVALENT 25 MCG/0.5ML IJ INJ: 0.5000 mL | INJECTION | INTRAMUSCULAR | Status: DC

## 2015-06-20 NOTE — Care Management Note (Addendum)
Case Management Note  Patient Details  Name: Isaac Gould MRN: [id number] Date of Birth: [birth year]  Subjective/Objective:                   Met with patient to discuss discharge planning. He denies RNCM needs although O2 is new to this patient. He appears diaphoretic. He states he is not having chest pains but having back pain that he "relates to laying in the bed". He states he has a cough but not nauseated. His wife is at bedside. He states his PCP is Dr. Lisette Grinder III. He is independent with daily activities. He denies needing or ever having home health services.  Action/Plan:   RNCM to follow for home O2 needs; Patient must have a chronic lung dx or CHF in order for O2 to be paid for. Acute illnesses such as PNA is not covered. O2 assessments also needed by RN.   Expected Discharge Date:                  Expected Discharge Plan:     In-House Referral:     Discharge planning Services  CM Consult  Post Acute Care Choice:    Choice offered to:  Patient, Spouse  DME Arranged:    DME Agency:     HH Arranged:    [place] Agency:     Status of Service:     Medicare Important Message Given:    Date Medicare IM Given:    Medicare IM give by:    Date Additional Medicare IM Given:    Additional Medicare Important Message give by:     If discussed at [place] of Stay Meetings, dates discussed:    Additional Comments:  Marshell Garfinkel, RN 06/20/2015, 1:36 PM

## 2015-06-20 NOTE — Plan of Care (Signed)
Problem: Phase I Progression Outcomes Goal: OOB as tolerated unless otherwise ordered Outcome: Progressing BSC

## 2015-06-20 NOTE — Progress Notes (Signed)
ANTICOAGULATION CONSULT NOTE - Follow up [facility] for enoxaparin Indication: pulmonary embolus  Allergies  Allergen Reactions  . Beta Adrenergic Blockers Other (See Comments)    Reaction:  Drops pts pulse rate  . Cozaar [Losartan Potassium] Other (See Comments)    Reaction:  Cramps   . Indomethacin Other (See Comments)    Reaction:  Bleeding     Patient Measurements: Height: 5\' 8"  (172.7 cm) Weight: 209 lb (94.802 kg) IBW/kg (Calculated) : 68.4   Vital Signs: Temp: 98.3 F (36.8 C) (07/29 0727) Temp Source: Oral (07/29 0727) BP: 99/61 mmHg (07/29 0727) Pulse Rate: 103 (07/29 0727)  Labs:  Recent Labs  06/19/15 1235  HGB 14.6  HCT 44.9  PLT 180  CREATININE 1.20    Estimated Creatinine Clearance: 57.6 mL/min (by C-G formula based on Cr of 1.2).   Medical History: Past Medical History  Diagnosis Date  . Coronary artery disease   . Hypertension   . Other abnormality of brain or central nervous system function study     poly \\rheumatic  myalgia  . BPH (benign prostatic hyperplasia)   . Polymyalgia rheumatica   . Rosacea      Assessment: 79 yo male with PE starting on enoxaparin 7/28: Hgb 14.6, Plt 180  Goal of Therapy:  Monitor platelets by anticoagulation protocol: Yes   Plan:  Will continue MD orders for enoxaparin 95 mg SQ q12h (1 mg /kg) Will need to continue to monitor SCr, Hgb and Plt  Chinita Greenland PharmD Clinical Pharmacist 06/20/2015

## 2015-06-20 NOTE — Progress Notes (Signed)
[facility] [place] NAME: Isaac Gould    MR#:  [id number]  DATE OF BIRTH:  09/29/1936  SUBJECTIVE: 79 year old male patient with hypertension, CAD, PMR came in because of shortness of breath and hypoxia with sats of 87% on room air found to have bilateral PE. He is on full dose Lovenox. He still on oxygen, on 2L of oxygen saturating around 91%. Found to have right leg DVT. Denies chest pain.   CHIEF COMPLAINT:   Chief Complaint  Patient presents with  . Shortness of Breath    REVIEW OF SYSTEMS:    Review of Systems  Constitutional: Negative for fever.  Eyes: Negative for blurred vision.  Respiratory: Positive for shortness of breath. Negative for cough.   Cardiovascular: Negative for chest pain.  Gastrointestinal: Negative for heartburn and diarrhea.  Genitourinary: Negative for urgency.  Musculoskeletal: Negative for neck pain.  Neurological: Negative for tingling and headaches.  Endo/Heme/Allergies: Does not bruise/bleed easily.  Psychiatric/Behavioral: Negative for depression and suicidal ideas.    Nutrition:  Tolerating Diet: Tolerating PT:      DRUG ALLERGIES:   Allergies  Allergen Reactions  . Beta Adrenergic Blockers Other (See Comments)    Reaction:  Drops pts pulse rate  . Cozaar [Losartan Potassium] Other (See Comments)    Reaction:  Cramps   . Indomethacin Other (See Comments)    Reaction:  Bleeding     VITALS:  Blood pressure 99/61, pulse 103, temperature 98.3 F (36.8 C), temperature source Oral, resp. rate 18, height 5\' 8"  (1.727 m), weight 94.802 kg (209 lb), SpO2 92 %.  PHYSICAL EXAMINATION:   Physical Exam  GENERAL:  79 y.o.-year-old patient lying in the bed with no acute distress.  EYES: Pupils equal, round, reactive to light and accommodation. No scleral icterus. Extraocular muscles intact.  HEENT: Head atraumatic, normocephalic. Oropharynx and nasopharynx clear.  NECK:  Supple, no jugular  venous distention. No thyroid enlargement, no tenderness.  LUNGS: Decreased breath sounds bilaterally CARDIOVASCULAR: S1, S2 normal. No murmurs, rubs, or gallops.  ABDOMEN: Soft, nontender, nondistended. Bowel sounds present. No organomegaly or mass.  EXTREMITIES: No pedal edema, cyanosis, or clubbing.  NEUROLOGIC: Cranial nerves II through XII are intact. Muscle strength 5/5 in all extremities. Sensation intact. Gait not checked.  PSYCHIATRIC: The patient is alert and oriented x 3.  SKIN: No obvious rash, lesion, or ulcer.    LABORATORY PANEL:   CBC  Recent Labs Lab 06/19/15 1235  WBC 15.8*  HGB 14.6  HCT 44.9  PLT 180   ------------------------------------------------------------------------------------------------------------------  Chemistries   Recent Labs Lab 06/19/15 1235  NA 136  K 3.9  CL 101  CO2 25  GLUCOSE 121*  BUN 17  CREATININE 1.20  CALCIUM 8.7*   ------------------------------------------------------------------------------------------------------------------  Cardiac Enzymes No results for input(s): TROPONINI in the last 168 hours. ------------------------------------------------------------------------------------------------------------------  RADIOLOGY:  Dg Chest 2 View  06/19/2015   CLINICAL DATA:  Coughing for 2 weeks, productive sputum.  EXAM: CHEST  2 VIEW  COMPARISON:  Chest x-ray dated 02/08/2010.  FINDINGS: Cardiomegaly is stable. Overall cardiomediastinal silhouette is unchanged in size or configuration. Suspect age-related aortic ectasia. There is mild scarring/ fibrosis within each lung. Also noted is central pulmonary vascular congestion and probable mild bilateral interstitial edema which appears similar to the previous exam. There is a small left pleural effusion which appears to be new.  Degenerative changes and ankylosis are seen throughout the scoliotic thoracic spine. No acute osseous abnormality.  IMPRESSION: 1. Cardiomegaly,  stable. 2. Central pulmonary vascular congestion and mild bilateral interstitial edema, likely on a background of mild chronic scarring/fibrosis, suggests mild volume overload/ congestive heart failure. Appearance is stable compared to a previous chest x-ray dated 02/08/2010 indicating a chronic versus recurrent mild congestive heart failure. 3. Small left pleural effusion.   Electronically Signed   By: Franki Cabot M.D.   On: 06/19/2015 13:41   Ct Angio Chest Pe W/cm &/or Wo Cm  06/19/2015   CLINICAL DATA:  Increased shortness of breath. Recent diagnosis of upper respiratory infection. On antibiotics.  EXAM: CT ANGIOGRAPHY CHEST WITH CONTRAST  TECHNIQUE: Multidetector CT imaging of the chest was performed using the standard protocol during bolus administration of intravenous contrast. Multiplanar CT image reconstructions and MIPs were obtained to evaluate the vascular anatomy.  CONTRAST:  139mL OMNIPAQUE IOHEXOL 350 MG/ML SOLN  COMPARISON:  Two-view chest x-ray from the same day.  FINDINGS: Extensive pulmonary emboli are noted. And nonobstructive subtle embolus is present at the main pulmonary bifurcation. Extensive obstructing emboli are noted in the lower lobe segmental arteries bilaterally. There is diffuse embolus in the right middle lobe arteries as well as the lingular arteries. Scattered nonobstructive emboli are noted in the upper lobes bilaterally.  In the largest axial diameter of the right ventricle is 5.4 cm. The largest axial diameter of the left ventricle is 3.4 cm. There is no significant pericardial effusion. A small the left moderate pleural effusion is present. There is associated airspace disease.  Ground-glass attenuation is noted inferiorly in the lingula. More prominent consolidation is seen posteriorly in the lingula. No significant right-sided airspace disease is present.  Exaggerated thoracic kyphosis is present with fusion of anterior osteophytes across multiple levels. A remote T7  compression fracture is noted.  Review of the MIP images confirms the above findings.  IMPRESSION: 1. Extensive pulmonary emboli in scattered throughout both lungs with a nonobstructive embolus extending across the main pulmonary artery bifurcation. Positive for acute PE with CT evidence of right heart strain (RV/LV Ratio = 1.6) consistent with at least submassive (intermediate risk) PE. The presence of right heart strain has been associated with an increased risk of morbidity and mortality. 2. Focal airspace disease posteriorly in the lingula is likely related to the emboli. 3. Small moderate left pleural effusion with associated atelectasis. 4. Exaggerated thoracic kyphosis with remote T7 compression fracture. Critical Value/emergent results were called by telephone at the time of interpretation on 06/19/2015 at 2:47 pm to Dr. Carrie Mew , who verbally acknowledged these results.   Electronically Signed   By: San Morelle M.D.   On: 06/19/2015 14:48   US Venous Img Lower Bilateral  06/19/2015   CLINICAL DATA:  Dyspnea.  Bilateral pulmonary emboli by CT.  EXAM: BILATERAL LOWER EXTREMITY VENOUS DOPPLER ULTRASOUND  TECHNIQUE: Gray-scale sonography with graded compression, as well as color Doppler and duplex ultrasound were performed to evaluate the lower extremity deep venous systems from the level of the common femoral vein and including the common femoral, femoral, profunda femoral, popliteal and calf veins including the posterior tibial, peroneal and gastrocnemius veins when visible. The superficial great saphenous vein was also interrogated. Spectral Doppler was utilized to evaluate flow at rest and with distal augmentation maneuvers in the common femoral, femoral and popliteal veins.  COMPARISON:  None.  FINDINGS: RIGHT LOWER EXTREMITY  Common Femoral Vein: There is nonocclusive thrombus in the common femoral vein, noncompressible but with some flow getting past.  Saphenofemoral Junction: There  is nonocclusive thrombus at the saphenous vein junction.  Profunda Femoral Vein: There is nonocclusive thrombus in the profunda femoral vein.  Femoral Vein: No evidence of thrombus. Normal compressibility, respiratory phasicity and response to augmentation.  Popliteal Vein: No evidence of thrombus. Normal compressibility, respiratory phasicity and response to augmentation.  Calf Veins: No evidence of thrombus. Normal compressibility and flow on color Doppler imaging.  Superficial Great Saphenous Vein: No evidence of thrombus. Normal compressibility and flow on color Doppler imaging.  Venous Reflux:  None.  Other Findings:  None.  LEFT LOWER EXTREMITY  Common Femoral Vein: No evidence of thrombus. Normal compressibility, respiratory phasicity and response to augmentation.  Saphenofemoral Junction: No evidence of thrombus. Normal compressibility and flow on color Doppler imaging.  Profunda Femoral Vein: No evidence of thrombus. Normal compressibility and flow on color Doppler imaging.  Femoral Vein: No evidence of thrombus. Normal compressibility, respiratory phasicity and response to augmentation.  Popliteal Vein: No evidence of thrombus. Normal compressibility, respiratory phasicity and response to augmentation.  Calf Veins: No evidence of thrombus. Normal compressibility and flow on color Doppler imaging.  Superficial Great Saphenous Vein: No evidence of thrombus. Normal compressibility and flow on color Doppler imaging.  Venous Reflux:  None.  Other Findings:  None.  IMPRESSION: The study is positive for deep venous thrombosis in the right lower extremity. There is nonocclusive thrombus proximally, involving the right common femoral vein, profunda femoral vein and saphenofemoral junction. No left-sided DVT.  These results will be called to the ordering clinician or representative by the Radiologist Assistant, and communication documented in the PACS or zVision Dashboard.   Electronically Signed   By: Andreas Newport M.D.   On: 06/19/2015 18:43     ASSESSMENT AND PLAN:   Active Problems:   Pneumonia  Acute hypoxic respiratory failure secondary to bilateral pulmonary emboli, pneumonia. Continue oxygen, continue full dose Lovenox, consult oncology for hypercoagulable workup, including protein c,protein S,.prothrombin mutation. 2. Right leg DVT may be causing pulmonary emboli.; #3. Community-acquired pneumonia continue azithromycin, doxycycline. History of polymyalgia rheumatica patient is on the prednisone and he sees  Dr.Wallace [facility], Leucocytosis ;likely  Due to steroids.      All the records are reviewed and case discussed with Care Management/Social Workerr. Management plans discussed with the patient, family and they are in agreement.  CODE STATUS: full  TOTAL TIME TAKING CARE OF THIS PATIENT: 35  minutes.   POSSIBLE D/C IN 1-2 DAYS, DEPENDING ON CLINICAL CONDITION.   Epifanio Lesches M.D on 06/20/2015 at 11:10 AM  Between 7am to 6pm - Pager - [phone]  After 6pm go to [email or web address] - password [email or web address]  [facility]  Office  [phone]  CC: Primary care physician; No primary care provider on file.

## 2015-06-20 NOTE — Consult Note (Signed)
[place]  [phone] [phone]) [phone]  ID: Robyn Haber OB: [birth year]  MR#: [id number]  OHY#:[id number]  No care team member to display  CHIEF COMPLAINT:  Chief Complaint  Patient presents with  . Shortness of Breath    INTERVAL HISTORY: Patient is a 79 year old male who noted increasing shortness of breath over the past several weeks. Despite a course of anti-biotics for presumed pneumonia, symptoms continued to get worse. Subsequent evaluation emergency room included CT scan which revealed bilateral pulmonary embolism. Currently, he still is short of breath but otherwise feels well. He has no neurologic complaints. He denies any recent fevers. He has a good appetite and denies weight loss. He denies any pain. He denies any nausea, vomiting, constipation, or diarrhea. He has no urinary complaints. Patient otherwise feels well and offers no further specific complaints.  REVIEW OF SYSTEMS:   Review of Systems  Constitutional: Positive for malaise/fatigue. Negative for fever and weight loss.  Respiratory: Positive for shortness of breath.   Cardiovascular: Negative.   Gastrointestinal: Negative.   Musculoskeletal: Negative.   Neurological: Positive for weakness.    As per HPI. Otherwise, a complete review of systems is negatve.  PAST MEDICAL HISTORY: Past Medical History  Diagnosis Date  . Coronary artery disease   . Hypertension   . Other abnormality of brain or central nervous system function study     poly \\rheumatic  myalgia  . BPH (benign prostatic hyperplasia)   . Polymyalgia rheumatica   . Rosacea     PAST SURGICAL HISTORY: Past Surgical History  Procedure Laterality Date  . Appendectomy      FAMILY HISTORY History reviewed. No pertinent family history.     ADVANCED DIRECTIVES:    HEALTH MAINTENANCE: History  Substance Use Topics  . Smoking status: Never Smoker   . Smokeless tobacco: Not on file  . Alcohol Use: No      Colonoscopy:  PAP:  Bone density:  Lipid panel:  Allergies  Allergen Reactions  . Beta Adrenergic Blockers Other (See Comments)    Reaction:  Drops pts pulse rate  . Cozaar [Losartan Potassium] Other (See Comments)    Reaction:  Cramps   . Indomethacin Other (See Comments)    Reaction:  Bleeding     Current Facility-Administered Medications  Medication Dose Route Frequency Provider Last Rate Last Dose  . acetaminophen (TYLENOL) tablet 650 mg  650 mg Oral Q6H PRN Hillary Bow, MD   650 mg at 06/20/15 9485   Or  . acetaminophen (TYLENOL) suppository 650 mg  650 mg Rectal Q6H PRN Hillary Bow, MD      . aspirin EC tablet 81 mg  81 mg Oral Daily Srikar Sudini, MD   81 mg at 06/20/15 1105  . azithromycin (ZITHROMAX) tablet 500 mg  500 mg Oral Daily Srikar Sudini, MD   500 mg at 06/20/15 1105  . bisacodyl (DULCOLAX) EC tablet 5 mg  5 mg Oral Daily PRN Srikar Sudini, MD      . doxazosin (CARDURA) tablet 2 mg  2 mg Oral QHS Hillary Bow, MD   2 mg at 06/19/15 2109  . doxycycline (VIBRA-TABS) tablet 50 mg  50 mg Oral Daily Hillary Bow, MD   50 mg at 06/20/15 1104  . enoxaparin (LOVENOX) injection 95 mg  1 mg/kg Subcutaneous Q12H Srikar Sudini, MD   95 mg at 06/20/15 0839  . guaiFENesin-dextromethorphan (ROBITUSSIN DM) 100-10 MG/5ML syrup 5 mL  5 mL Oral Q4H PRN Hillary Bow, MD      .  ipratropium-albuterol (DUONEB) 0.5-2.5 (3) MG/3ML nebulizer solution 3 mL  3 mL Nebulization Q4H PRN Srikar Sudini, MD      . omega-3 acid ethyl esters (LOVAZA) capsule 1 g  1 g Oral BID Hillary Bow, MD   1 g at 06/20/15 1104  . ondansetron (ZOFRAN) tablet 4 mg  4 mg Oral Q6H PRN Hillary Bow, MD       Or  . ondansetron (ZOFRAN) injection 4 mg  4 mg Intravenous Q6H PRN Srikar Sudini, MD      . Derrill Memo ON 06/21/2015] pneumococcal 23 valent vaccine (PNU-IMMUNE) injection 0.5 mL  0.5 mL Intramuscular Tomorrow-1000 Srikar Sudini, MD      . predniSONE (DELTASONE) tablet 2.5 mg  2.5 mg Oral QPM Srikar  Sudini, MD   2.5 mg at 06/19/15 1900  . predniSONE (DELTASONE) tablet 5 mg  5 mg Oral Q breakfast Hillary Bow, MD   5 mg at 06/20/15 3846  . simvastatin (ZOCOR) tablet 40 mg  40 mg Oral q1800 Hillary Bow, MD   40 mg at 06/19/15 1904    OBJECTIVE: Filed Vitals:   06/20/15 1558  BP: 96/66  Pulse: 95  Temp: 98.1 F (36.7 C)  Resp: 16     Body mass index is 31.79 kg/(m^2).    ECOG FS:1 - Symptomatic but completely ambulatory  General: Well-developed, well-nourished, no acute distress. Eyes: Pink conjunctiva, anicteric sclera. HEENT: Normocephalic, moist mucous membranes, clear oropharnyx. Lungs: Clear to auscultation bilaterally. Heart: Regular rate and rhythm. No rubs, murmurs, or gallops. Abdomen: Soft, nontender, nondistended. No organomegaly noted, normoactive bowel sounds. Musculoskeletal: No edema, cyanosis, or clubbing. Neuro: Alert, answering all questions appropriately. Cranial nerves grossly intact. Skin: No rashes or petechiae noted. Psych: Normal affect. Lymphatics: No cervical, calvicular, axillary or inguinal LAD.   LAB RESULTS:  Lab Results  Component Value Date   NA 136 06/19/2015   K 3.9 06/19/2015   CL 101 06/19/2015   CO2 25 06/19/2015   GLUCOSE 121* 06/19/2015   BUN 17 06/19/2015   CREATININE 1.20 06/19/2015   CALCIUM 8.7* 06/19/2015   GFRNONAA 57* 06/19/2015   GFRAA >60 06/19/2015    Lab Results  Component Value Date   WBC 15.8* 06/19/2015   HGB 14.6 06/19/2015   HCT 44.9 06/19/2015   MCV 91.7 06/19/2015   PLT 180 06/19/2015     STUDIES: Dg Chest 2 View  06/19/2015   CLINICAL DATA:  Coughing for 2 weeks, productive sputum.  EXAM: CHEST  2 VIEW  COMPARISON:  Chest x-ray dated 02/08/2010.  FINDINGS: Cardiomegaly is stable. Overall cardiomediastinal silhouette is unchanged in size or configuration. Suspect age-related aortic ectasia. There is mild scarring/ fibrosis within each lung. Also noted is central pulmonary vascular congestion and  probable mild bilateral interstitial edema which appears similar to the previous exam. There is a small left pleural effusion which appears to be new.  Degenerative changes and ankylosis are seen throughout the scoliotic thoracic spine. No acute osseous abnormality.  IMPRESSION: 1. Cardiomegaly, stable. 2. Central pulmonary vascular congestion and mild bilateral interstitial edema, likely on a background of mild chronic scarring/fibrosis, suggests mild volume overload/ congestive heart failure. Appearance is stable compared to a previous chest x-ray dated 02/08/2010 indicating a chronic versus recurrent mild congestive heart failure. 3. Small left pleural effusion.   Electronically Signed   By: Franki Cabot M.D.   On: 06/19/2015 13:41   Ct Angio Chest Pe W/cm &/or Wo Cm  06/19/2015   CLINICAL DATA:  Increased shortness  of breath. Recent diagnosis of upper respiratory infection. On antibiotics.  EXAM: CT ANGIOGRAPHY CHEST WITH CONTRAST  TECHNIQUE: Multidetector CT imaging of the chest was performed using the standard protocol during bolus administration of intravenous contrast. Multiplanar CT image reconstructions and MIPs were obtained to evaluate the vascular anatomy.  CONTRAST:  181mL OMNIPAQUE IOHEXOL 350 MG/ML SOLN  COMPARISON:  Two-view chest x-ray from the same day.  FINDINGS: Extensive pulmonary emboli are noted. And nonobstructive subtle embolus is present at the main pulmonary bifurcation. Extensive obstructing emboli are noted in the lower lobe segmental arteries bilaterally. There is diffuse embolus in the right middle lobe arteries as well as the lingular arteries. Scattered nonobstructive emboli are noted in the upper lobes bilaterally.  In the largest axial diameter of the right ventricle is 5.4 cm. The largest axial diameter of the left ventricle is 3.4 cm. There is no significant pericardial effusion. A small the left moderate pleural effusion is present. There is associated airspace disease.   Ground-glass attenuation is noted inferiorly in the lingula. More prominent consolidation is seen posteriorly in the lingula. No significant right-sided airspace disease is present.  Exaggerated thoracic kyphosis is present with fusion of anterior osteophytes across multiple levels. A remote T7 compression fracture is noted.  Review of the MIP images confirms the above findings.  IMPRESSION: 1. Extensive pulmonary emboli in scattered throughout both lungs with a nonobstructive embolus extending across the main pulmonary artery bifurcation. Positive for acute PE with CT evidence of right heart strain (RV/LV Ratio = 1.6) consistent with at least submassive (intermediate risk) PE. The presence of right heart strain has been associated with an increased risk of morbidity and mortality. 2. Focal airspace disease posteriorly in the lingula is likely related to the emboli. 3. Small moderate left pleural effusion with associated atelectasis. 4. Exaggerated thoracic kyphosis with remote T7 compression fracture. Critical Value/emergent results were called by telephone at the time of interpretation on 06/19/2015 at 2:47 pm to Dr. Carrie Mew , who verbally acknowledged these results.   Electronically Signed   By: San Morelle M.D.   On: 06/19/2015 14:48   US Venous Img Lower Bilateral  06/19/2015   CLINICAL DATA:  Dyspnea.  Bilateral pulmonary emboli by CT.  EXAM: BILATERAL LOWER EXTREMITY VENOUS DOPPLER ULTRASOUND  TECHNIQUE: Gray-scale sonography with graded compression, as well as color Doppler and duplex ultrasound were performed to evaluate the lower extremity deep venous systems from the level of the common femoral vein and including the common femoral, femoral, profunda femoral, popliteal and calf veins including the posterior tibial, peroneal and gastrocnemius veins when visible. The superficial great saphenous vein was also interrogated. Spectral Doppler was utilized to evaluate flow at rest and with  distal augmentation maneuvers in the common femoral, femoral and popliteal veins.  COMPARISON:  None.  FINDINGS: RIGHT LOWER EXTREMITY  Common Femoral Vein: There is nonocclusive thrombus in the common femoral vein, noncompressible but with some flow getting past.  Saphenofemoral Junction: There is nonocclusive thrombus at the saphenous vein junction.  Profunda Femoral Vein: There is nonocclusive thrombus in the profunda femoral vein.  Femoral Vein: No evidence of thrombus. Normal compressibility, respiratory phasicity and response to augmentation.  Popliteal Vein: No evidence of thrombus. Normal compressibility, respiratory phasicity and response to augmentation.  Calf Veins: No evidence of thrombus. Normal compressibility and flow on color Doppler imaging.  Superficial Great Saphenous Vein: No evidence of thrombus. Normal compressibility and flow on color Doppler imaging.  Venous Reflux:  None.  Other Findings:  None.  LEFT LOWER EXTREMITY  Common Femoral Vein: No evidence of thrombus. Normal compressibility, respiratory phasicity and response to augmentation.  Saphenofemoral Junction: No evidence of thrombus. Normal compressibility and flow on color Doppler imaging.  Profunda Femoral Vein: No evidence of thrombus. Normal compressibility and flow on color Doppler imaging.  Femoral Vein: No evidence of thrombus. Normal compressibility, respiratory phasicity and response to augmentation.  Popliteal Vein: No evidence of thrombus. Normal compressibility, respiratory phasicity and response to augmentation.  Calf Veins: No evidence of thrombus. Normal compressibility and flow on color Doppler imaging.  Superficial Great Saphenous Vein: No evidence of thrombus. Normal compressibility and flow on color Doppler imaging.  Venous Reflux:  None.  Other Findings:  None.  IMPRESSION: The study is positive for deep venous thrombosis in the right lower extremity. There is nonocclusive thrombus proximally, involving the right  common femoral vein, profunda femoral vein and saphenofemoral junction. No left-sided DVT.  These results will be called to the ordering clinician or representative by the Radiologist Assistant, and communication documented in the PACS or zVision Dashboard.   Electronically Signed   By: Andreas Newport M.D.   On: 06/19/2015 18:43    ASSESSMENT: Unprovoked pulmonary embolism and DVT.  PLAN:    1. Pulmonary embolism/DVT: Patient does not appear to have any transient risk factors and a full hypercoagulable workup has been initiated and is currently pending. Patient will require a minimum of 3 months of anticoagulation on either Xarelto or Eliquis. Once patient is stable, okay to discharge from hematology standpoint. He should follow-up in the [place] in 3-4 weeks to discuss his hypercoagulable workup and further evaluation. Patient expressed understanding and was in agreement with this plan.  Appreciate consult, call with questions.  Lloyd Huger, MD   06/20/2015 6:04 PM

## 2015-06-21 LAB — PSA: PSA: 2.72 ng/mL (ref 0.00–4.00)

## 2015-06-21 MED ORDER — APIXABAN 5 MG PO TABS: 5.0000 mg | ORAL_TABLET | Freq: Two times a day (BID) | ORAL | Status: DC

## 2015-06-21 MED ORDER — APIXABAN 5 MG PO TABS
10.0000 mg | ORAL_TABLET | Freq: Two times a day (BID) | ORAL | Status: DC
  Administered 2015-06-21 – 2015-06-24 (×6): 10 mg via ORAL
  Filled 2015-06-21 (×6): qty 2

## 2015-06-21 NOTE — Progress Notes (Signed)
ANTICOAGULATION CONSULT NOTE - Initial Consult  Pharmacy Consult for Apixaban Indication: pulmonary embolus  Allergies  Allergen Reactions  . Beta Adrenergic Blockers Other (See Comments)    Reaction:  Drops pts pulse rate  . Cozaar [Losartan Potassium] Other (See Comments)    Reaction:  Cramps   . Indomethacin Other (See Comments)    Reaction:  Bleeding     Patient Measurements: Height: 5\' 8"  (172.7 cm) Weight: 209 lb (94.802 kg) IBW/kg (Calculated) : 68.4 Heparin Dosing Weight:   Vital Signs: Temp: 98.3 F (36.8 C) (07/30 0837) Temp Source: Oral (07/30 0837) BP: 126/89 mmHg (07/30 0837) Pulse Rate: 100 (07/30 0837)  Labs:  Recent Labs  06/19/15 1235  HGB 14.6  HCT 44.9  PLT 180  CREATININE 1.20    Estimated Creatinine Clearance: 57.6 mL/min (by C-G formula based on Cr of 1.2).   Medical History: Past Medical History  Diagnosis Date  . Coronary artery disease   . Hypertension   . Other abnormality of brain or central nervous system function study     poly \\rheumatic  myalgia  . BPH (benign prostatic hyperplasia)   . Polymyalgia rheumatica   . Rosacea     Medications:  Scheduled:  . apixaban  10 mg Oral BID  . [START ON 06/28/2015] apixaban  5 mg Oral BID  . aspirin EC  81 mg Oral Daily  . azithromycin  500 mg Oral Daily  . doxazosin  2 mg Oral QHS  . doxycycline  50 mg Oral Daily  . omega-3 acid ethyl esters  1 g Oral BID  . pneumococcal 23 valent vaccine  0.5 mL Intramuscular Tomorrow-1000  . predniSONE  2.5 mg Oral QPM  . predniSONE  5 mg Oral Q breakfast  . simvastatin  40 mg Oral q1800    Assessment: Pt with PE CrCl = 57.6 m/min Previously treated with Lovenox 95 mg SQ Q12H,  Last dose on 7/30 @ 8:40  Goal of Therapy:  resolution of PE   Plan:  Will order Apixaban 10 mg PO BID X 7 days to start 7/30 @ 21:00. Will order Apixaban 5 mg PO BID to start 8/6 @ 22:00.   Benyamin Jeff D 06/21/2015,9:47 AM

## 2015-06-21 NOTE — Progress Notes (Signed)
Pt received orders to ambulate and checks sats; Primary RN notified Dr. Volanda Napoleon To confirm order with pt being admitted with Multiple PE and DVT. Dr. Volanda Napoleon stated that it was ok to walk pt around in room.  Pt was also complaining of urgency and frequency. Orders received to bladder scan pt. Primary RN to continue to monitor.

## 2015-06-21 NOTE — Progress Notes (Signed)
Primary nurse notified Dr. Volanda Napoleon of Pt O2 sat and HR after ambulation in room on room air. Dr. Volanda Napoleon was also notified of bladder scan of greater than 200 ml after voiding 100 ml. Orders received  For PRN in and out cath for discomfort. Primary nurse to continue to monitor.

## 2015-06-21 NOTE — Progress Notes (Signed)
[facility] [place] NAME: Isaac Gould    MR#:  [id number]  DATE OF BIRTH:  December 12, 1935  SUBJECTIVE:  CHIEF COMPLAINT:   Chief Complaint  Patient presents with  . Shortness of Breath   Short of breath with rest, no pain.  REVIEW OF SYSTEMS:   Review of Systems  Constitutional: Negative for fever.  Respiratory: Positive for cough, sputum production and shortness of breath.   Cardiovascular: Negative for chest pain and palpitations.  Gastrointestinal: Negative for nausea, vomiting and abdominal pain.  Genitourinary: Negative for dysuria.    DRUG ALLERGIES:   Allergies  Allergen Reactions  . Beta Adrenergic Blockers Other (See Comments)    Reaction:  Drops pts pulse rate  . Cozaar [Losartan Potassium] Other (See Comments)    Reaction:  Cramps   . Indomethacin Other (See Comments)    Reaction:  Bleeding     VITALS:  Blood pressure 126/89, pulse 100, temperature 98.3 F (36.8 C), temperature source Oral, resp. rate 19, height 5\' 8"  (1.727 m), weight 94.802 kg (209 lb), SpO2 96 %.  PHYSICAL EXAMINATION:  GENERAL:  79 y.o.-year-old patient lying in the bed, heavy breathing EYES: Pupils equal, round, reactive to light and accommodation. No scleral icterus. Extraocular muscles intact.  HEENT: Head atraumatic, normocephalic. Oropharynx and nasopharynx clear.  NECK:  Supple, no jugular venous distention. No thyroid enlargement, no tenderness.  LUNGS: Normal breath sounds bilaterally, no wheezing, rales, rhonchi or crepitation. Seems short of breath even at rest.  CARDIOVASCULAR: S1, S2 normal. No murmurs, rubs, or gallops.  ABDOMEN: Soft, nontender, nondistended. Bowel sounds present. No organomegaly or mass.  EXTREMITIES: No pedal edema, cyanosis, or clubbing.  NEUROLOGIC: Cranial nerves II through XII are intact. Muscle strength 5/5 in all extremities. Sensation intact. Gait not checked.  PSYCHIATRIC: The patient is alert and  oriented x 3.  SKIN: No obvious rash, lesion, or ulcer.    LABORATORY PANEL:   CBC  Recent Labs Lab 06/19/15 1235  WBC 15.8*  HGB 14.6  HCT 44.9  PLT 180   ------------------------------------------------------------------------------------------------------------------  Chemistries   Recent Labs Lab 06/19/15 1235  NA 136  K 3.9  CL 101  CO2 25  GLUCOSE 121*  BUN 17  CREATININE 1.20  CALCIUM 8.7*   ------------------------------------------------------------------------------------------------------------------  Cardiac Enzymes No results for input(s): TROPONINI in the last 168 hours. ------------------------------------------------------------------------------------------------------------------  RADIOLOGY:  Dg Chest 2 View  06/19/2015   CLINICAL DATA:  Coughing for 2 weeks, productive sputum.  EXAM: CHEST  2 VIEW  COMPARISON:  Chest x-ray dated 02/08/2010.  FINDINGS: Cardiomegaly is stable. Overall cardiomediastinal silhouette is unchanged in size or configuration. Suspect age-related aortic ectasia. There is mild scarring/ fibrosis within each lung. Also noted is central pulmonary vascular congestion and probable mild bilateral interstitial edema which appears similar to the previous exam. There is a small left pleural effusion which appears to be new.  Degenerative changes and ankylosis are seen throughout the scoliotic thoracic spine. No acute osseous abnormality.  IMPRESSION: 1. Cardiomegaly, stable. 2. Central pulmonary vascular congestion and mild bilateral interstitial edema, likely on a background of mild chronic scarring/fibrosis, suggests mild volume overload/ congestive heart failure. Appearance is stable compared to a previous chest x-ray dated 02/08/2010 indicating a chronic versus recurrent mild congestive heart failure. 3. Small left pleural effusion.   Electronically Signed   By: Franki Cabot M.D.   On: 06/19/2015 13:41   Ct Angio Chest Pe W/cm &/or Wo  Cm  06/19/2015   CLINICAL DATA:  Increased shortness of breath. Recent diagnosis of upper respiratory infection. On antibiotics.  EXAM: CT ANGIOGRAPHY CHEST WITH CONTRAST  TECHNIQUE: Multidetector CT imaging of the chest was performed using the standard protocol during bolus administration of intravenous contrast. Multiplanar CT image reconstructions and MIPs were obtained to evaluate the vascular anatomy.  CONTRAST:  18mL OMNIPAQUE IOHEXOL 350 MG/ML SOLN  COMPARISON:  Two-view chest x-ray from the same day.  FINDINGS: Extensive pulmonary emboli are noted. And nonobstructive subtle embolus is present at the main pulmonary bifurcation. Extensive obstructing emboli are noted in the lower lobe segmental arteries bilaterally. There is diffuse embolus in the right middle lobe arteries as well as the lingular arteries. Scattered nonobstructive emboli are noted in the upper lobes bilaterally.  In the largest axial diameter of the right ventricle is 5.4 cm. The largest axial diameter of the left ventricle is 3.4 cm. There is no significant pericardial effusion. A small the left moderate pleural effusion is present. There is associated airspace disease.  Ground-glass attenuation is noted inferiorly in the lingula. More prominent consolidation is seen posteriorly in the lingula. No significant right-sided airspace disease is present.  Exaggerated thoracic kyphosis is present with fusion of anterior osteophytes across multiple levels. A remote T7 compression fracture is noted.  Review of the MIP images confirms the above findings.  IMPRESSION: 1. Extensive pulmonary emboli in scattered throughout both lungs with a nonobstructive embolus extending across the main pulmonary artery bifurcation. Positive for acute PE with CT evidence of right heart strain (RV/LV Ratio = 1.6) consistent with at least submassive (intermediate risk) PE. The presence of right heart strain has been associated with an increased risk of morbidity and  mortality. 2. Focal airspace disease posteriorly in the lingula is likely related to the emboli. 3. Small moderate left pleural effusion with associated atelectasis. 4. Exaggerated thoracic kyphosis with remote T7 compression fracture. Critical Value/emergent results were called by telephone at the time of interpretation on 06/19/2015 at 2:47 pm to Dr. Carrie Mew , who verbally acknowledged these results.   Electronically Signed   By: San Morelle M.D.   On: 06/19/2015 14:48   US Venous Img Lower Bilateral  06/19/2015   CLINICAL DATA:  Dyspnea.  Bilateral pulmonary emboli by CT.  EXAM: BILATERAL LOWER EXTREMITY VENOUS DOPPLER ULTRASOUND  TECHNIQUE: Gray-scale sonography with graded compression, as well as color Doppler and duplex ultrasound were performed to evaluate the lower extremity deep venous systems from the level of the common femoral vein and including the common femoral, femoral, profunda femoral, popliteal and calf veins including the posterior tibial, peroneal and gastrocnemius veins when visible. The superficial great saphenous vein was also interrogated. Spectral Doppler was utilized to evaluate flow at rest and with distal augmentation maneuvers in the common femoral, femoral and popliteal veins.  COMPARISON:  None.  FINDINGS: RIGHT LOWER EXTREMITY  Common Femoral Vein: There is nonocclusive thrombus in the common femoral vein, noncompressible but with some flow getting past.  Saphenofemoral Junction: There is nonocclusive thrombus at the saphenous vein junction.  Profunda Femoral Vein: There is nonocclusive thrombus in the profunda femoral vein.  Femoral Vein: No evidence of thrombus. Normal compressibility, respiratory phasicity and response to augmentation.  Popliteal Vein: No evidence of thrombus. Normal compressibility, respiratory phasicity and response to augmentation.  Calf Veins: No evidence of thrombus. Normal compressibility and flow on color Doppler imaging.  Superficial  Great Saphenous Vein: No evidence of thrombus. Normal compressibility and flow on color  Doppler imaging.  Venous Reflux:  None.  Other Findings:  None.  LEFT LOWER EXTREMITY  Common Femoral Vein: No evidence of thrombus. Normal compressibility, respiratory phasicity and response to augmentation.  Saphenofemoral Junction: No evidence of thrombus. Normal compressibility and flow on color Doppler imaging.  Profunda Femoral Vein: No evidence of thrombus. Normal compressibility and flow on color Doppler imaging.  Femoral Vein: No evidence of thrombus. Normal compressibility, respiratory phasicity and response to augmentation.  Popliteal Vein: No evidence of thrombus. Normal compressibility, respiratory phasicity and response to augmentation.  Calf Veins: No evidence of thrombus. Normal compressibility and flow on color Doppler imaging.  Superficial Great Saphenous Vein: No evidence of thrombus. Normal compressibility and flow on color Doppler imaging.  Venous Reflux:  None.  Other Findings:  None.  IMPRESSION: The study is positive for deep venous thrombosis in the right lower extremity. There is nonocclusive thrombus proximally, involving the right common femoral vein, profunda femoral vein and saphenofemoral junction. No left-sided DVT.  These results will be called to the ordering clinician or representative by the Radiologist Assistant, and communication documented in the PACS or zVision Dashboard.   Electronically Signed   By: Andreas Newport M.D.   On: 06/19/2015 18:43    EKG:   Orders placed or performed during the hospital encounter of 06/19/15  . ED EKG  . ED EKG  . ED EKG  . ED EKG  . EKG 12-Lead  . EKG 12-Lead  . EKG    ASSESSMENT AND PLAN:   1) acute submassive PE and right leg DVT - transition from lovenox to eliquis - hypercoagulability work up pending, will follow up with hem/onc. Polymyalgia my contribute to risk - still requiring 5L o2 via nasal canula, will likely need on discharge  as well - no swelling, phlebitis from DVT - ECHO with LVH, elevated PA pressures, will consult cardiology  2) CAP - continue azithromycin, doxy  3) polymyalgia rheumatica - continue prednisone   All the records are reviewed and case discussed with Care Management/Social Workerr. Management plans discussed with the patient, family and they are in agreement.  CODE STATUS: full  TOTAL TIME TAKING CARE OF THIS PATIENT: 35 minutes.  Greater than 50% of time spent in care coordination and counseling. POSSIBLE D/C IN 1-2 DAYS, DEPENDING ON CLINICAL CONDITION.   Myrtis Ser M.D on 06/21/2015 at 12:18 PM  Between 7am to 6pm - Pager - [phone]  After 6pm go to [email or web address] - password [email or web address]  [facility]  Office  [phone]  CC: Primary care physician; No primary care provider on file.

## 2015-06-22 LAB — CBC
HEMATOCRIT: 40 % (ref 40.0–52.0)
Hemoglobin: 13.1 g/dL (ref 13.0–18.0)
MCH: 29.8 pg (ref 26.0–34.0)
MCHC: 32.6 g/dL (ref 32.0–36.0)
MCV: 91.3 fL (ref 80.0–100.0)
PLATELETS: 183 10*3/uL (ref 150–440)
RBC: 4.38 MIL/uL — AB (ref 4.40–5.90)
RDW: 13.5 % (ref 11.5–14.5)
WBC: 12 10*3/uL — ABNORMAL HIGH (ref 3.8–10.6)

## 2015-06-22 LAB — BASIC METABOLIC PANEL
ANION GAP: 7 (ref 5–15)
BUN: 15 mg/dL (ref 6–20)
CO2: 27 mmol/L (ref 22–32)
CREATININE: 1.04 mg/dL (ref 0.61–1.24)
Calcium: 8.5 mg/dL — ABNORMAL LOW (ref 8.9–10.3)
Chloride: 105 mmol/L (ref 101–111)
GFR calc Af Amer: >60 mL/min (ref >60)
GFR calc non Af Amer: >60 mL/min (ref >60)
GLUCOSE: 100 mg/dL — AB (ref 65–99)
Potassium: 3.7 mmol/L (ref 3.5–5.1)
Sodium: 139 mmol/L (ref 135–145)

## 2015-06-22 LAB — EXPECTORATED SPUTUM ASSESSMENT W REFEX TO RESP CULTURE

## 2015-06-22 LAB — CEA: CEA: 3.4 ng/mL (ref 0.0–4.7)

## 2015-06-22 LAB — CANCER ANTIGEN 19-9: CA 19-9: 29 U/mL (ref 0–35)

## 2015-06-22 LAB — EXPECTORATED SPUTUM ASSESSMENT W GRAM STAIN, RFLX TO RESP C

## 2015-06-22 MED ORDER — FAMOTIDINE 20 MG PO TABS
20.0000 mg | ORAL_TABLET | Freq: Every day | ORAL | Status: DC
  Administered 2015-06-22 – 2015-06-24 (×3): 20 mg via ORAL
  Filled 2015-06-22 (×3): qty 1

## 2015-06-22 NOTE — Progress Notes (Signed)
[place] [facility] [place] NAME: Isaac Gould    MR#:  [id number]  DATE OF BIRTH:  [birth year]  SUBJECTIVE:  CHIEF COMPLAINT:   Chief Complaint  Patient presents with  . Shortness of Breath   Continues to be short of breath with rest, desats to 84% with ambulation.  REVIEW OF SYSTEMS:   Review of Systems  Constitutional: Negative for fever.  Respiratory: Positive for cough, sputum production and shortness of breath.   Cardiovascular: Negative for chest pain and palpitations.  Gastrointestinal: Negative for nausea, vomiting and abdominal pain.  Genitourinary: Negative for dysuria.    DRUG ALLERGIES:   Allergies  Allergen Reactions  . Beta Adrenergic Blockers Other (See Comments)    Reaction:  Drops pts pulse rate  . Cozaar [Losartan Potassium] Other (See Comments)    Reaction:  Cramps   . Indomethacin Other (See Comments)    Reaction:  Bleeding     VITALS:  Blood pressure 130/82, pulse 93, temperature 98.3 F (36.8 C), temperature source Oral, resp. rate 24, height 5\' 8"  (1.727 m), weight 94.802 kg (209 lb), SpO2 93 %.  PHYSICAL EXAMINATION:  GENERAL:  80 y.o.-year-old patient lying in the bed, heavy breathing EYES: Pupils equal, round, reactive to light and accommodation. No scleral icterus. Extraocular muscles intact.  HEENT: Head atraumatic, normocephalic. Oropharynx and nasopharynx clear.  NECK:  Supple, no jugular venous distention. No thyroid enlargement, no tenderness.  LUNGS: Normal breath sounds bilaterally, no wheezing, rales, rhonchi or crepitation. Seems short of breath even at rest.  CARDIOVASCULAR: S1, S2 normal. No murmurs, rubs, or gallops.  ABDOMEN: Soft, nontender, nondistended. Bowel sounds present. No organomegaly or mass.  EXTREMITIES: No pedal edema, cyanosis, or clubbing.  NEUROLOGIC: Cranial nerves II through XII are intact. Muscle strength 5/5 in all extremities. Sensation intact. Gait not checked.   PSYCHIATRIC: The patient is alert and oriented x 3.  SKIN: No obvious rash, lesion, or ulcer.    LABORATORY PANEL:   CBC  Recent Labs Lab 06/22/15 0616  WBC 12.0*  HGB 13.1  HCT 40.0  PLT 183   ------------------------------------------------------------------------------------------------------------------  Chemistries   Recent Labs Lab 06/22/15 0616  NA 139  K 3.7  CL 105  CO2 27  GLUCOSE 100*  BUN 15  CREATININE 1.04  CALCIUM 8.5*   ------------------------------------------------------------------------------------------------------------------  Cardiac Enzymes No results for input(s): TROPONINI in the last 168 hours. ------------------------------------------------------------------------------------------------------------------  RADIOLOGY:  No results found.  EKG:   Orders placed or performed during the hospital encounter of 06/19/15  . ED EKG  . ED EKG  . ED EKG  . ED EKG  . EKG 12-Lead  . EKG 12-Lead  . EKG    ASSESSMENT AND PLAN:   1) acute submassive PE and right leg DVT - transition from lovenox to eliquis - hypercoagulability work up pending, will follow up with hem/onc as an outpatient. Polymyalgia my contribute to risk - Desats to 84% with ambulation, no O2 requirement at rest. We'll need to repeat ambulatory sats - no swelling, phlebitis from DVT - ECHO with LVH, elevated PA pressures, cardiology consultation pending  2) CAP - continue azithromycin, doxy - Would cultures negative to date, sputum culture obtained today  3) polymyalgia rheumatica - continue prednisone   All the records are reviewed and case discussed with Care Management/Social Workerr. Management plans discussed with the patient, family and they are in agreement.  CODE STATUS: full  TOTAL TIME TAKING CARE OF THIS PATIENT: 89  minutes.  Greater than 50% of time spent in care coordination and counseling. POSSIBLE D/C IN 1-2 DAYS, DEPENDING ON CLINICAL  CONDITION.   Myrtis Ser M.D on 06/22/2015 at 1:25 PM  Between 7am to 6pm - Pager - [phone]  After 6pm go to [email or web address] - password [email or web address]  [facility]  Office  [phone]  CC: Primary care physician; No primary care provider on file.

## 2015-06-22 NOTE — Consult Note (Signed)
Isaac Gould  Patient ID: Isaac Gould, MRN: [id number], DOB/AGE: Mar 28, 1936 79 y.o. Admit date: 06/19/2015   Date of Consult: 06/22/2015 Primary Physician: No primary care provider on file. Primary Cardiologist: Nehemiah Massed  Chief Complaint:  Chief Complaint  Patient presents with  . Shortness of Breath   Reason for Consult: shortness of breath with pulmonary embolism and right heart strain  HPI: 79 y.o. male with known coronary artery disease essential hypertension who is had recent evidence of pneumonia and possible hemoptysis. After the patient was seen as an outpatient he had significant hypoxia and significant tachycardia with rapid ventricular rate. The patient then was found to have a pulmonary embolism by CAT scan with a deep venous thrombosis likely from the cause. With this the patient continues to have appropriate treatment options with Eliquis for further risk reduction. He has not had any further hemoptysis since the initiation of this medication management. Echocardiogram shows normal LV systolic function with ejection fraction of 55% and no evidence of significant right heart strain to any degree. The patient therefore will continue medication management including oxygenation  Past Medical History  Diagnosis Date  . Coronary artery disease   . Hypertension   . Other abnormality of brain or central nervous system function study     poly \\rheumatic  myalgia  . BPH (benign prostatic hyperplasia)   . Polymyalgia rheumatica   . Rosacea       Surgical History:  Past Surgical History  Procedure Laterality Date  . Appendectomy       Home Meds: Prior to Admission medications   Medication Sig Start Date End Date Taking? Authorizing Provider  aspirin EC 81 MG tablet Take 81 mg by mouth at bedtime.   Yes Historical Provider, MD  candesartan (ATACAND) 4 MG tablet Take 4 mg by mouth daily.   Yes Historical Provider, MD  doxazosin (CARDURA) 2 MG  tablet Take 2 mg by mouth at bedtime.   Yes Historical Provider, MD  doxycycline (VIBRAMYCIN) 50 MG capsule Take 50 mg by mouth daily.   Yes Historical Provider, MD  Multiple Vitamins-Minerals (CENTRUM SILVER PO) Take 1 tablet by mouth daily.   Yes Historical Provider, MD  Omega-3 Fatty Acids (FISH OIL) 1000 MG CAPS Take 1 capsule by mouth 2 (two) times daily.   Yes Historical Provider, MD  predniSONE (DELTASONE) 5 MG tablet Take 2.5-5 mg by mouth 2 (two) times daily. Pt takes one tablet in the morning and one-half tablet in the evening.   Yes Historical Provider, MD  simvastatin (ZOCOR) 40 MG tablet Take 40 mg by mouth at bedtime.   Yes Historical Provider, MD    Inpatient Medications:  . apixaban  10 mg Oral BID  . [START ON 06/28/2015] apixaban  5 mg Oral BID  . aspirin EC  81 mg Oral Daily  . azithromycin  500 mg Oral Daily  . doxazosin  2 mg Oral QHS  . doxycycline  50 mg Oral Daily  . famotidine  20 mg Oral Daily  . omega-3 acid ethyl esters  1 g Oral BID  . pneumococcal 23 valent vaccine  0.5 mL Intramuscular Tomorrow-1000  . predniSONE  2.5 mg Oral QPM  . predniSONE  5 mg Oral Q breakfast  . simvastatin  40 mg Oral q1800      Allergies:  Allergies  Allergen Reactions  . Beta Adrenergic Blockers Other (See Comments)    Reaction:  Drops pts pulse rate  . Cozaar [Losartan Potassium]  Other (See Comments)    Reaction:  Cramps   . Indomethacin Other (See Comments)    Reaction:  Bleeding     History   Social History  . Marital Status: Married    Spouse Name: N/A  . Number of Children: N/A  . Years of Education: N/A   Occupational History  . Not on file.   Social History Main Topics  . Smoking status: Never Smoker   . Smokeless tobacco: Not on file  . Alcohol Use: No  . Drug Use: Not on file  . Sexual Activity: Not on file   Other Topics Concern  . Not on file   Social History Narrative  . No narrative on file     History reviewed. No pertinent family  history.   Review of Systems Positive for shortness of breath and hemoptysis Negative for: General:  chills, fever, night sweats or weight changes.  Cardiovascular: PND orthopnea syncope dizziness  Dermatological skin lesions rashes Respiratory: Cough congestion Urologic: Frequent urination urination at night and hematuria Abdominal: negative for nausea, vomiting, diarrhea, bright red blood per rectum, melena, or hematemesis Neurologic: negative for visual changes, and/or hearing changes  All other systems reviewed and are otherwise negative except as noted above.  Labs: No results for input(s): CKTOTAL, CKMB, TROPONINI in the last 72 hours. Lab Results  Component Value Date   WBC 12.0* 06/22/2015   HGB 13.1 06/22/2015   HCT 40.0 06/22/2015   MCV 91.3 06/22/2015   PLT 183 06/22/2015    Recent Labs Lab 06/22/15 0616  NA 139  K 3.7  CL 105  CO2 27  BUN 15  CREATININE 1.04  CALCIUM 8.5*  GLUCOSE 100*   No results found for: CHOL, HDL, LDLCALC, TRIG No results found for: DDIMER  Radiology/Studies:  Dg Chest 2 View  06/19/2015   CLINICAL DATA:  Coughing for 2 weeks, productive sputum.  EXAM: CHEST  2 VIEW  COMPARISON:  Chest x-ray dated 02/08/2010.  FINDINGS: Cardiomegaly is stable. Overall cardiomediastinal silhouette is unchanged in size or configuration. Suspect age-related aortic ectasia. There is mild scarring/ fibrosis within each lung. Also noted is central pulmonary vascular congestion and probable mild bilateral interstitial edema which appears similar to the previous exam. There is a small left pleural effusion which appears to be new.  Degenerative changes and ankylosis are seen throughout the scoliotic thoracic spine. No acute osseous abnormality.  IMPRESSION: 1. Cardiomegaly, stable. 2. Central pulmonary vascular congestion and mild bilateral interstitial edema, likely on a background of mild chronic scarring/fibrosis, suggests mild volume overload/ congestive heart  failure. Appearance is stable compared to a previous chest x-ray dated 02/08/2010 indicating a chronic versus recurrent mild congestive heart failure. 3. Small left pleural effusion.   Electronically Signed   By: Franki Cabot M.D.   On: 06/19/2015 13:41   Ct Angio Chest Pe W/cm &/or Wo Cm  06/19/2015   CLINICAL DATA:  Increased shortness of breath. Recent diagnosis of upper respiratory infection. On antibiotics.  EXAM: CT ANGIOGRAPHY CHEST WITH CONTRAST  TECHNIQUE: Multidetector CT imaging of the chest was performed using the standard protocol during bolus administration of intravenous contrast. Multiplanar CT image reconstructions and MIPs were obtained to evaluate the vascular anatomy.  CONTRAST:  154mL OMNIPAQUE IOHEXOL 350 MG/ML SOLN  COMPARISON:  Two-view chest x-ray from the same day.  FINDINGS: Extensive pulmonary emboli are noted. And nonobstructive subtle embolus is present at the main pulmonary bifurcation. Extensive obstructing emboli are noted in the lower lobe  segmental arteries bilaterally. There is diffuse embolus in the right middle lobe arteries as well as the lingular arteries. Scattered nonobstructive emboli are noted in the upper lobes bilaterally.  In the largest axial diameter of the right ventricle is 5.4 cm. The largest axial diameter of the left ventricle is 3.4 cm. There is no significant pericardial effusion. A small the left moderate pleural effusion is present. There is associated airspace disease.  Ground-glass attenuation is noted inferiorly in the lingula. More prominent consolidation is seen posteriorly in the lingula. No significant right-sided airspace disease is present.  Exaggerated thoracic kyphosis is present with fusion of anterior osteophytes across multiple levels. A remote T7 compression fracture is noted.  Review of the MIP images confirms the above findings.  IMPRESSION: 1. Extensive pulmonary emboli in scattered throughout both lungs with a nonobstructive embolus  extending across the main pulmonary artery bifurcation. Positive for acute PE with CT evidence of right heart strain (RV/LV Ratio = 1.6) consistent with at least submassive (intermediate risk) PE. The presence of right heart strain has been associated with an increased risk of morbidity and mortality. 2. Focal airspace disease posteriorly in the lingula is likely related to the emboli. 3. Small moderate left pleural effusion with associated atelectasis. 4. Exaggerated thoracic kyphosis with remote T7 compression fracture. Critical Value/emergent results were called by telephone at the time of interpretation on 06/19/2015 at 2:47 pm to Dr. Carrie Mew , who verbally acknowledged these results.   Electronically Signed   By: San Morelle M.D.   On: 06/19/2015 14:48   US Venous Img Lower Bilateral  06/19/2015   CLINICAL DATA:  Dyspnea.  Bilateral pulmonary emboli by CT.  EXAM: BILATERAL LOWER EXTREMITY VENOUS DOPPLER ULTRASOUND  TECHNIQUE: Gray-scale sonography with graded compression, as well as color Doppler and duplex ultrasound were performed to evaluate the lower extremity deep venous systems from the level of the common femoral vein and including the common femoral, femoral, profunda femoral, popliteal and calf veins including the posterior tibial, peroneal and gastrocnemius veins when visible. The superficial great saphenous vein was also interrogated. Spectral Doppler was utilized to evaluate flow at rest and with distal augmentation maneuvers in the common femoral, femoral and popliteal veins.  COMPARISON:  None.  FINDINGS: RIGHT LOWER EXTREMITY  Common Femoral Vein: There is nonocclusive thrombus in the common femoral vein, noncompressible but with some flow getting past.  Saphenofemoral Junction: There is nonocclusive thrombus at the saphenous vein junction.  Profunda Femoral Vein: There is nonocclusive thrombus in the profunda femoral vein.  Femoral Vein: No evidence of thrombus. Normal  compressibility, respiratory phasicity and response to augmentation.  Popliteal Vein: No evidence of thrombus. Normal compressibility, respiratory phasicity and response to augmentation.  Calf Veins: No evidence of thrombus. Normal compressibility and flow on color Doppler imaging.  Superficial Great Saphenous Vein: No evidence of thrombus. Normal compressibility and flow on color Doppler imaging.  Venous Reflux:  None.  Other Findings:  None.  LEFT LOWER EXTREMITY  Common Femoral Vein: No evidence of thrombus. Normal compressibility, respiratory phasicity and response to augmentation.  Saphenofemoral Junction: No evidence of thrombus. Normal compressibility and flow on color Doppler imaging.  Profunda Femoral Vein: No evidence of thrombus. Normal compressibility and flow on color Doppler imaging.  Femoral Vein: No evidence of thrombus. Normal compressibility, respiratory phasicity and response to augmentation.  Popliteal Vein: No evidence of thrombus. Normal compressibility, respiratory phasicity and response to augmentation.  Calf Veins: No evidence of thrombus. Normal compressibility and flow  on color Doppler imaging.  Superficial Great Saphenous Vein: No evidence of thrombus. Normal compressibility and flow on color Doppler imaging.  Venous Reflux:  None.  Other Findings:  None.  IMPRESSION: The study is positive for deep venous thrombosis in the right lower extremity. There is nonocclusive thrombus proximally, involving the right common femoral vein, profunda femoral vein and saphenofemoral junction. No left-sided DVT.  These results will be called to the ordering clinician or representative by the Radiologist Assistant, and communication documented in the PACS or zVision Dashboard.   Electronically Signed   By: Andreas Newport M.D.   On: 06/19/2015 18:43    EKG: Normal sinus rhythm. Normal EKG  Weights: Filed Weights   06/19/15 1210 06/19/15 1616  Weight: 208 lb (94.348 kg) 209 lb (94.802 kg)      Physical Exam: Blood pressure 130/82, pulse 93, temperature 98.3 F (36.8 C), temperature source Oral, resp. rate 24, height 5\' 8"  (1.727 m), weight 209 lb (94.802 kg), SpO2 82 %. Body mass index is 31.79 kg/(m^2). General: Well developed, well nourished, in no acute distress. Head eyes ears nose throat: Normocephalic, atraumatic, sclera non-icteric, no xanthomas, nares are without discharge. No apparent thyromegaly and/or mass  Lungs: Normal respiratory effort.  no wheezes, few basilar rales, no rhonchi.  Heart: RRR with normal S1 S2. no murmur gallop, no rub, PMI is normal size and placement, carotid upstroke normal without bruit, jugular venous pressure is normal Abdomen: Soft, non-tender, non-distended with normoactive bowel sounds. No hepatomegaly. No rebound/guarding. No obvious abdominal masses. Abdominal aorta is normal size without bruit Extremities: Trace edema. no cyanosis, no clubbing, no ulcers  Peripheral : 2+ bilateral upper extremity pulses, 2+ bilateral femoral pulses, 2+ bilateral dorsal pedal pulse Neuro: Alert and oriented. No facial asymmetry. No focal deficit. Moves all extremities spontaneously. Musculoskeletal: Normal muscle tone without kyphosis Psych:  Responds to questions appropriately with a normal affect.    Assessment: 79 year old male with known coronary artery disease essential hypertension with deep venous thrombosis pulmonary embolism tachycardia and hemoptysis with possible infiltrate slightly improved at this time with no evidence of recurrence of hemoptysis on appropriate medication management and no current evidence of myocardial infarction or congestive heart failure  Plan: 1. Continue anticoagulation for further risk reduction in pulmonary embolism and and deep venous thrombosis 2. Antibiotics for pulmonary infection as needed 3. Oxygenation and possible home oxygen as necessary 4. No further cardiac intervention at this time with no evidence of  congestive heart failure and/or recurrence of tachycardia 5. Ambulation and follow for improvements of symptoms  Signed, Corey Skains M.D. [facility] 06/22/2015, 4:24 PM

## 2015-06-22 NOTE — Progress Notes (Signed)
   06/22/15 1500  Clinical Encounter Type  Visited With Family  Visit Type Spiritual support  Spiritual Encounters  Spiritual Needs Prayer  Stress Factors  Family Stress Factors None identified   Status: 25 yr/male;   Pneumonia    Family: a couple from the church, male and male Faith: Christian nondenom Visit Assessment: The gentleman said that the patient was doing much better than when he first got here. He also said that the patient attends a small church in [employer]. The chaplain gave encouraging words and introduced Pastoral Care to he and his wife.  Pastoral care can be reached via pager at [phone] or online

## 2015-06-23 LAB — PROTEIN C DEFICIENCY PROFILE
Protein C Activity: 81 % (ref 74–151)
Protein C, Total: 68 % — ABNORMAL LOW (ref 70–140)

## 2015-06-23 LAB — BASIC METABOLIC PANEL
ANION GAP: 7 (ref 5–15)
BUN: 18 mg/dL (ref 6–20)
CO2: 26 mmol/L (ref 22–32)
Calcium: 8.4 mg/dL — ABNORMAL LOW (ref 8.9–10.3)
Chloride: 106 mmol/L (ref 101–111)
Creatinine, Ser: 1.04 mg/dL (ref 0.61–1.24)
GFR calc non Af Amer: >60 mL/min (ref >60)
GLUCOSE: 96 mg/dL (ref 65–99)
POTASSIUM: 3.7 mmol/L (ref 3.5–5.1)
Sodium: 139 mmol/L (ref 135–145)

## 2015-06-23 LAB — CBC
HCT: 40 % (ref 40.0–52.0)
Hemoglobin: 13 g/dL (ref 13.0–18.0)
MCH: 29.7 pg (ref 26.0–34.0)
MCHC: 32.6 g/dL (ref 32.0–36.0)
MCV: 91.2 fL (ref 80.0–100.0)
Platelets: 191 10*3/uL (ref 150–440)
RBC: 4.39 MIL/uL — ABNORMAL LOW (ref 4.40–5.90)
RDW: 13.3 % (ref 11.5–14.5)
WBC: 12.7 10*3/uL — ABNORMAL HIGH (ref 3.8–10.6)

## 2015-06-23 MED ORDER — DOXYCYCLINE HYCLATE 100 MG PO TABS
100.0000 mg | ORAL_TABLET | Freq: Two times a day (BID) | ORAL | Status: DC
  Administered 2015-06-23 – 2015-06-24 (×2): 100 mg via ORAL
  Filled 2015-06-23 (×2): qty 1

## 2015-06-23 MED ORDER — BENZONATATE 100 MG PO CAPS
200.0000 mg | ORAL_CAPSULE | Freq: Three times a day (TID) | ORAL | Status: DC
  Administered 2015-06-23 (×3): 200 mg via ORAL
  Filled 2015-06-23 (×3): qty 2

## 2015-06-23 MED ORDER — GUAIFENESIN ER 600 MG PO TB12
600.0000 mg | ORAL_TABLET | Freq: Two times a day (BID) | ORAL | Status: DC
  Administered 2015-06-23 – 2015-06-24 (×3): 600 mg via ORAL
  Filled 2015-06-23 (×3): qty 1

## 2015-06-23 NOTE — Care Management Note (Signed)
Case Management Note  Patient Details  Name: Isaac Gould MRN: [id number] Date of Birth: 08-29-1936  Subjective/Objective:        Provided Mr Gift with a coupon for Eliquis. He was at 98% O2 Sat on room air earlier today but is now back on 2L N/C. Will arrange home oxygen at discharge if unable to wean.             Action/Plan:   Expected Discharge Date:                  Expected Discharge Plan:     In-House Referral:     Discharge planning Services  CM Consult  Post Acute Care Choice:    Choice offered to:  Patient, Spouse  DME Arranged:    DME Agency:     HH Arranged:    [place] Agency:     Status of Service:     Medicare Important Message Given:  Yes-second notification given Date Medicare IM Given:    Medicare IM give by:    Date Additional Medicare IM Given:    Additional Medicare Important Message give by:     If discussed at [place] of Stay Meetings, dates discussed:    Additional Comments:  Wally Behan A, RN 06/23/2015, 12:08 PM

## 2015-06-23 NOTE — Progress Notes (Signed)
[facility] Encounter Note  Patient: Isaac Gould / Admit Date: 06/19/2015 / Date of Encounter: 06/23/2015, 8:21 AM   Subjective: Patient has improved breathing with no evidence of lower extremity edema  Review of Systems: Positive for: Mild shortness of breath Negative for: Vision change, hearing change, syncope, dizziness, nausea, vomiting,diarrhea, bloody stool, stomach pain, cough, congestion, diaphoresis, urinary frequency, urinary pain,skin lesions, skin rashes Others previously listed  Objective: Telemetry: Normal sinus rhythm Physical Exam: Blood pressure 124/82, pulse 93, temperature 98.3 F (36.8 C), temperature source Oral, resp. rate 18, height 5\' 8"  (1.727 m), weight 209 lb (94.802 kg), SpO2 94 %. Body mass index is 31.79 kg/(m^2). General: Well developed, well nourished, in no acute distress. Head: Normocephalic, atraumatic, sclera non-icteric, no xanthomas, nares are without discharge. Neck: No apparent masses Lungs: Normal respirations with few wheezes, few rhonchi, no rales , no crackles   Heart: Regular rate and rhythm, normal S1 S2, no murmur, no rub, no gallop, PMI is normal size and placement, carotid upstroke normal without bruit, jugular venous pressure normal Abdomen: Soft, non-tender, non-distended with normoactive bowel sounds. No hepatosplenomegaly. Abdominal aorta is normal size without bruit Extremities: No edema, no clubbing, no cyanosis, no ulcers,  Peripheral: 2+ radial, 2+ femoral, 2+ dorsal pedal pulses Neuro: Alert and oriented. Moves all extremities spontaneously. Psych:  Responds to questions appropriately with a normal affect.   Intake/Output Summary (Last 24 hours) at 06/23/15 0821 Last data filed at 06/23/15 0427  Gross per 24 hour  Intake    580 ml  Output   3200 ml  Net  -2620 ml    Inpatient Medications:  . apixaban  10 mg Oral BID  . [START ON 06/28/2015] apixaban  5 mg Oral BID  . aspirin EC  81 mg Oral Daily  .  azithromycin  500 mg Oral Daily  . doxazosin  2 mg Oral QHS  . doxycycline  50 mg Oral Daily  . famotidine  20 mg Oral Daily  . omega-3 acid ethyl esters  1 g Oral BID  . pneumococcal 23 valent vaccine  0.5 mL Intramuscular Tomorrow-1000  . predniSONE  2.5 mg Oral QPM  . predniSONE  5 mg Oral Q breakfast  . simvastatin  40 mg Oral q1800   Infusions:    Labs:  Recent Labs  06/22/15 0616 06/23/15 0654  NA 139 139  K 3.7 3.7  CL 105 106  CO2 27 26  GLUCOSE 100* 96  BUN 15 18  CREATININE 1.04 1.04  CALCIUM 8.5* 8.4*   No results for input(s): AST, ALT, ALKPHOS, BILITOT, PROT, ALBUMIN in the last 72 hours.  Recent Labs  06/22/15 0616 06/23/15 0654  WBC 12.0* 12.7*  HGB 13.1 13.0  HCT 40.0 40.0  MCV 91.3 91.2  PLT 183 191   No results for input(s): CKTOTAL, CKMB, TROPONINI in the last 72 hours. Invalid input(s): POCBNP No results for input(s): HGBA1C in the last 72 hours.   Weights: Filed Weights   06/19/15 1210 06/19/15 1616  Weight: 208 lb (94.348 kg) 209 lb (94.802 kg)     Radiology/Studies:  Dg Chest 2 View  06/19/2015   CLINICAL DATA:  Coughing for 2 weeks, productive sputum.  EXAM: CHEST  2 VIEW  COMPARISON:  Chest x-ray dated 02/08/2010.  FINDINGS: Cardiomegaly is stable. Overall cardiomediastinal silhouette is unchanged in size or configuration. Suspect age-related aortic ectasia. There is mild scarring/ fibrosis within each lung. Also noted is central pulmonary vascular congestion and  probable mild bilateral interstitial edema which appears similar to the previous exam. There is a small left pleural effusion which appears to be new.  Degenerative changes and ankylosis are seen throughout the scoliotic thoracic spine. No acute osseous abnormality.  IMPRESSION: 1. Cardiomegaly, stable. 2. Central pulmonary vascular congestion and mild bilateral interstitial edema, likely on a background of mild chronic scarring/fibrosis, suggests mild volume overload/ congestive  heart failure. Appearance is stable compared to a previous chest x-ray dated 02/08/2010 indicating a chronic versus recurrent mild congestive heart failure. 3. Small left pleural effusion.   Electronically Signed   By: Franki Cabot M.D.   On: 06/19/2015 13:41   Ct Angio Chest Pe W/cm &/or Wo Cm  06/19/2015   CLINICAL DATA:  Increased shortness of breath. Recent diagnosis of upper respiratory infection. On antibiotics.  EXAM: CT ANGIOGRAPHY CHEST WITH CONTRAST  TECHNIQUE: Multidetector CT imaging of the chest was performed using the standard protocol during bolus administration of intravenous contrast. Multiplanar CT image reconstructions and MIPs were obtained to evaluate the vascular anatomy.  CONTRAST:  123mL OMNIPAQUE IOHEXOL 350 MG/ML SOLN  COMPARISON:  Two-view chest x-ray from the same day.  FINDINGS: Extensive pulmonary emboli are noted. And nonobstructive subtle embolus is present at the main pulmonary bifurcation. Extensive obstructing emboli are noted in the lower lobe segmental arteries bilaterally. There is diffuse embolus in the right middle lobe arteries as well as the lingular arteries. Scattered nonobstructive emboli are noted in the upper lobes bilaterally.  In the largest axial diameter of the right ventricle is 5.4 cm. The largest axial diameter of the left ventricle is 3.4 cm. There is no significant pericardial effusion. A small the left moderate pleural effusion is present. There is associated airspace disease.  Ground-glass attenuation is noted inferiorly in the lingula. More prominent consolidation is seen posteriorly in the lingula. No significant right-sided airspace disease is present.  Exaggerated thoracic kyphosis is present with fusion of anterior osteophytes across multiple levels. A remote T7 compression fracture is noted.  Review of the MIP images confirms the above findings.  IMPRESSION: 1. Extensive pulmonary emboli in scattered throughout both lungs with a nonobstructive  embolus extending across the main pulmonary artery bifurcation. Positive for acute PE with CT evidence of right heart strain (RV/LV Ratio = 1.6) consistent with at least submassive (intermediate risk) PE. The presence of right heart strain has been associated with an increased risk of morbidity and mortality. 2. Focal airspace disease posteriorly in the lingula is likely related to the emboli. 3. Small moderate left pleural effusion with associated atelectasis. 4. Exaggerated thoracic kyphosis with remote T7 compression fracture. Critical Value/emergent results were called by telephone at the time of interpretation on 06/19/2015 at 2:47 pm to Dr. Carrie Mew , who verbally acknowledged these results.   Electronically Signed   By: San Morelle M.D.   On: 06/19/2015 14:48   US Venous Img Lower Bilateral  06/19/2015   CLINICAL DATA:  Dyspnea.  Bilateral pulmonary emboli by CT.  EXAM: BILATERAL LOWER EXTREMITY VENOUS DOPPLER ULTRASOUND  TECHNIQUE: Gray-scale sonography with graded compression, as well as color Doppler and duplex ultrasound were performed to evaluate the lower extremity deep venous systems from the level of the common femoral vein and including the common femoral, femoral, profunda femoral, popliteal and calf veins including the posterior tibial, peroneal and gastrocnemius veins when visible. The superficial great saphenous vein was also interrogated. Spectral Doppler was utilized to evaluate flow at rest and with distal augmentation maneuvers  in the common femoral, femoral and popliteal veins.  COMPARISON:  None.  FINDINGS: RIGHT LOWER EXTREMITY  Common Femoral Vein: There is nonocclusive thrombus in the common femoral vein, noncompressible but with some flow getting past.  Saphenofemoral Junction: There is nonocclusive thrombus at the saphenous vein junction.  Profunda Femoral Vein: There is nonocclusive thrombus in the profunda femoral vein.  Femoral Vein: No evidence of thrombus.  Normal compressibility, respiratory phasicity and response to augmentation.  Popliteal Vein: No evidence of thrombus. Normal compressibility, respiratory phasicity and response to augmentation.  Calf Veins: No evidence of thrombus. Normal compressibility and flow on color Doppler imaging.  Superficial Great Saphenous Vein: No evidence of thrombus. Normal compressibility and flow on color Doppler imaging.  Venous Reflux:  None.  Other Findings:  None.  LEFT LOWER EXTREMITY  Common Femoral Vein: No evidence of thrombus. Normal compressibility, respiratory phasicity and response to augmentation.  Saphenofemoral Junction: No evidence of thrombus. Normal compressibility and flow on color Doppler imaging.  Profunda Femoral Vein: No evidence of thrombus. Normal compressibility and flow on color Doppler imaging.  Femoral Vein: No evidence of thrombus. Normal compressibility, respiratory phasicity and response to augmentation.  Popliteal Vein: No evidence of thrombus. Normal compressibility, respiratory phasicity and response to augmentation.  Calf Veins: No evidence of thrombus. Normal compressibility and flow on color Doppler imaging.  Superficial Great Saphenous Vein: No evidence of thrombus. Normal compressibility and flow on color Doppler imaging.  Venous Reflux:  None.  Other Findings:  None.  IMPRESSION: The study is positive for deep venous thrombosis in the right lower extremity. There is nonocclusive thrombus proximally, involving the right common femoral vein, profunda femoral vein and saphenofemoral junction. No left-sided DVT.  These results will be called to the ordering clinician or representative by the Radiologist Assistant, and communication documented in the PACS or zVision Dashboard.   Electronically Signed   By: Andreas Newport M.D.   On: 06/19/2015 18:43     Assessment and Recommendation  79 y.o. male with known coronary artery disease essential hypertension and recent deep venous thrombosis  causing a pulmonary embolism and subsequent tachycardia and no current evidence of myocardial infarction slowly improving with the overall echocardiogram showing no evidence of significant myocardial infarction and/or LV systolic dysfunction with reasonable pulmonary pressures 1. Continue anticoagulation for further risk reduction in pulmonary embolism and or further treatment of deep venous thrombosis 2. Okay for discontinuation of aspirin if concerns for bleeding 3. Begin ambulation and follow for any further significant symptoms and adjustments of oxygen as well as necessary 4. No further cardiac diagnostics necessary at this time although follow-up in one to 2 weeks  Signed, Serafina Royals M.D. FACC

## 2015-06-23 NOTE — Care Management Note (Signed)
Case Management Note  Patient Details  Name: AIKEN WITHEM MRN: [id number] Date of Birth: [birth year]  Subjective/Objective:         06/23/15 at 3:30pm = Rest without oxygen=96% SAT, Exercise without oxygen=91% SAT. Exercise with oxygen=94%.  Does not qualify for home oxygen per Medicare guidelines.         Action/Plan:   Expected Discharge Date:                  Expected Discharge Plan:     In-House Referral:     Discharge planning Services  CM Consult  Post Acute Care Choice:    Choice offered to:  Patient, Spouse  DME Arranged:    DME Agency:     HH Arranged:    [place] Agency:     Status of Service:     Medicare Important Message Given:  Yes-second notification given Date Medicare IM Given:    Medicare IM give by:    Date Additional Medicare IM Given:    Additional Medicare Important Message give by:     If discussed at [place] of Stay Meetings, dates discussed:    Additional Comments:  Tanesia Butner A, RN 06/23/2015, 3:29 PM

## 2015-06-23 NOTE — Progress Notes (Signed)
[place] [facility] [place] NAME: Isaac Gould    MR#:  [id number]  DATE OF BIRTH:  Jan 10, 1936  SUBJECTIVE:  CHIEF COMPLAINT:   Chief Complaint  Patient presents with  . Shortness of Breath   Shortness of breath improved. He has been able to ambulate comfortably but still requires 2 L of nasal cannula with ambulation to keep sats above 90%. Last night while sleeping he had desaturation to 82% while he was on 1 L, oxygen was increased to 4 L at that time. At rest he is comfortable on room air with sats in the high 90s. No chest pain. Continues to have cough with sputum production.  REVIEW OF SYSTEMS:   Review of Systems  Constitutional: Negative for fever.  Respiratory: Positive for cough, sputum production and shortness of breath.   Cardiovascular: Negative for chest pain and palpitations.  Gastrointestinal: Negative for nausea, vomiting and abdominal pain.  Genitourinary: Negative for dysuria.    DRUG ALLERGIES:   Allergies  Allergen Reactions  . Beta Adrenergic Blockers Other (See Comments)    Reaction:  Drops pts pulse rate  . Cozaar [Losartan Potassium] Other (See Comments)    Reaction:  Cramps   . Indomethacin Other (See Comments)    Reaction:  Bleeding     VITALS:  Blood pressure 124/82, pulse 93, temperature 98.3 F (36.8 C), temperature source Oral, resp. rate 18, height 5\' 8"  (1.727 m), weight 94.802 kg (209 lb), SpO2 96 %.  PHYSICAL EXAMINATION:  GENERAL:  79 y.o.-year-old patient lying in the bed, comfortable EYES: Pupils equal, round, reactive to light and accommodation. No scleral icterus. Extraocular muscles intact.  HEENT: Head atraumatic, normocephalic. Oropharynx and nasopharynx clear.  NECK:  Supple, no jugular venous distention. No thyroid enlargement, no tenderness.  LUNGS: Normal breath sounds bilaterally, no wheezing, rales, rhonchi or crepitation. CARDIOVASCULAR: S1, S2 normal. No murmurs, rubs, or  gallops.  ABDOMEN: Soft, nontender, nondistended. Bowel sounds present. No organomegaly or mass.  EXTREMITIES: No pedal edema, cyanosis, or clubbing.  NEUROLOGIC: Cranial nerves II through XII are intact. Muscle strength 5/5 in all extremities. Sensation intact. Gait not checked.  PSYCHIATRIC: The patient is alert and oriented x 3.  SKIN: No obvious rash, lesion, or ulcer.    LABORATORY PANEL:   CBC  Recent Labs Lab 06/23/15 0654  WBC 12.7*  HGB 13.0  HCT 40.0  PLT 191   ------------------------------------------------------------------------------------------------------------------  Chemistries   Recent Labs Lab 06/23/15 0654  NA 139  K 3.7  CL 106  CO2 26  GLUCOSE 96  BUN 18  CREATININE 1.04  CALCIUM 8.4*   ------------------------------------------------------------------------------------------------------------------  Cardiac Enzymes No results for input(s): TROPONINI in the last 168 hours. ------------------------------------------------------------------------------------------------------------------  RADIOLOGY:  No results found.  EKG:   Orders placed or performed during the hospital encounter of 06/19/15  . ED EKG  . ED EKG  . ED EKG  . ED EKG  . EKG 12-Lead  . EKG 12-Lead  . EKG    ASSESSMENT AND PLAN:   1) acute submassive PE and right leg DVT - Continue Eliquis, coupon provided - hypercoagulability work up pending, will follow up with hem/onc as an outpatient. Polymyalgia my contribute to risk - Will need to continue daily ambulatory oxygen challenge to evaluate for home health oxygen need - no swelling, phlebitis from DVT  2) CAP - continue azithromycin, doxy - Would cultures negative to date, sputum culture obtained   3) polymyalgia rheumatica - continue  prednisone  4) left ventricular hypertrophy - Appreciate cardiology consultation, no further recommendations. No congestive heart failure at this time.   All the records are  reviewed and case discussed with Care Management/Social Workerr. Management plans discussed with the patient, family and they are in agreement.  CODE STATUS: full  TOTAL TIME TAKING CARE OF THIS PATIENT: 35 minutes.  Greater than 50% of time spent in care coordination and counseling. POSSIBLE D/C IN 1-2 DAYS, DEPENDING ON CLINICAL CONDITION.   Myrtis Ser M.D on 06/23/2015 at 1:25 PM  Between 7am to 6pm - Pager - [phone]  After 6pm go to [email or web address] - password [email or web address] [facility]  [place] [facility]  Office  [phone]  CC: Primary care physician; No primary care provider on file.

## 2015-06-23 NOTE — Care Management Important Message (Signed)
Important Message  Patient Details  Name: Isaac Gould MRN: [id number] Date of Birth: [birth year]   Medicare Important Message Given:  Yes-second notification given    [name] 06/23/2015, 10:24 AM

## 2015-06-24 DIAGNOSIS — J189 Pneumonia, unspecified organism: Secondary | ICD-10-CM | POA: Diagnosis present

## 2015-06-24 DIAGNOSIS — R0602 Shortness of breath: Secondary | ICD-10-CM | POA: Diagnosis present

## 2015-06-24 DIAGNOSIS — I824Z1 Acute embolism and thrombosis of unspecified deep veins of right distal lower extremity: Secondary | ICD-10-CM | POA: Diagnosis present

## 2015-06-24 DIAGNOSIS — I2699 Other pulmonary embolism without acute cor pulmonale: Secondary | ICD-10-CM | POA: Diagnosis present

## 2015-06-24 DIAGNOSIS — I517 Cardiomegaly: Secondary | ICD-10-CM | POA: Diagnosis present

## 2015-06-24 LAB — CULTURE, BLOOD (ROUTINE X 2)
CULTURE: NO GROWTH
Culture: NO GROWTH

## 2015-06-24 MED ORDER — AZITHROMYCIN 250 MG PO TABS: ORAL_TABLET | ORAL | Status: DC

## 2015-06-24 MED ORDER — GUAIFENESIN ER 600 MG PO TB12: 600.0000 mg | ORAL_TABLET | Freq: Two times a day (BID) | ORAL | Status: DC

## 2015-06-24 MED ORDER — APIXABAN 5 MG PO TABS: 5.0000 mg | ORAL_TABLET | Freq: Two times a day (BID) | ORAL | Status: AC

## 2015-06-24 MED ORDER — APIXABAN 5 MG PO TABS: 10.0000 mg | ORAL_TABLET | Freq: Two times a day (BID) | ORAL | Status: DC

## 2015-06-24 MED ORDER — BENZONATATE 200 MG PO CAPS: 200.0000 mg | ORAL_CAPSULE | Freq: Three times a day (TID) | ORAL | Status: DC

## 2015-06-24 MED ORDER — FAMOTIDINE 20 MG PO TABS: 20.0000 mg | ORAL_TABLET | Freq: Every day | ORAL | Status: DC

## 2015-06-24 NOTE — Progress Notes (Signed)
Initial Nutrition Assessment      INTERVENTION:   Meals and snacks: Cater to pt preferences  NUTRITION DIAGNOSIS:    (None at this time) related to   as evidenced by  .    GOAL:   Patient will meet greater than or equal to 90% of their needs    MONITOR:    (Energy intake)  REASON FOR ASSESSMENT:   LOS    ASSESSMENT:   Pt admitted with shortness of breath, PE, right leg DVT, pneumonia  Past Medical History  Diagnosis Date  . Coronary artery disease   . Hypertension   . Other abnormality of brain or central nervous system function study     poly \\rheumatic  myalgia  . BPH (benign prostatic hyperplasia)   . Polymyalgia rheumatica   . Rosacea     Current Nutrition: eating mostly 75-100% of meals and tolerating well  Food/Nutrition-Related History: pt reports for the past week prior to admission secondary to shortness of breath and not feeling well   Medications: dulcolax, omega 3, predisone  Electrolyte/Renal Profile and Glucose Profile:   Recent Labs Lab 06/19/15 1235 06/22/15 0616 06/23/15 0654  NA 136 139 139  K 3.9 3.7 3.7  CL 101 105 106  CO2 25 27 26   BUN 17 15 18   CREATININE 1.20 1.04 1.04  CALCIUM 8.7* 8.5* 8.4*  GLUCOSE 121* 100* 96     Last BM:8/01    Weight Change: stable wt per pt    Diet Order:  Diet Heart Room service appropriate?: Yes; Fluid consistency:: Thin  Skin:  Reviewed, no issues  Height:   Ht Readings from Last 1 Encounters:  06/19/15 5\' 8"  (1.727 m)    Weight:   Wt Readings from Last 1 Encounters:  06/19/15 209 lb (94.802 kg)       BMI:  Body mass index is 31.79 kg/(m^2).   EDUCATION NEEDS:   No education needs identified at this time  LOW Care Level  Shevonne Wolf B. Zenia Resides, [place], [place] (pager)

## 2015-06-24 NOTE — Care Management (Signed)
Spoke with patient and spouse to discuss discharge plan which is to go home with O2 and on Eliquis.  Contacted [facility] [place] and spoke with Judson Roch.  Patient will need pre authorization for Eliquis from his PCP Dr Orrin Brigham and Judson Roch stated that she would submit this to Dr Gilford Rile. Judson Roch ran 55 free trial card and authorized 30 day of medication for free. Script and discount card given to spouse at bedside. Patient agrees to pay for home 02 until sleep study completed by [facility].  DME request sent and acknowledged. Informed Dr Volanda Napoleon of plan. Patient does not qualify for 02 during the day but notes indicate that he desaturates at night. No other needs identified. Gave my contact information and told patient to contact me with any questions.

## 2015-06-24 NOTE — Progress Notes (Signed)
SATURATION QUALIFICATIONS: (This note is used to comply with regulatory documentation for home oxygen)  Patient Saturations on Room Air at Rest = 97%  Patient Saturations on Room Air while Ambulating = 93%  Patient Saturations on 0 Liters of oxygen while Ambulating   Please briefly explain why patient needs home oxygen:  Pt saturation drops during the night while he is sleeping.  Pt requires 2L O2 while sleeping to keep oxygen saturation above 88%.

## 2015-06-24 NOTE — Discharge Instructions (Signed)
Please contact your physician for any increase in shortness of breath or difficulty breathing, increase in cough, or any other questions or concerns.   DIET:  Regular diet  DISCHARGE CONDITION:  Fair  ACTIVITY:  Activity as tolerated  OXYGEN:  Home Oxygen: Yes.     Oxygen Delivery: 2 liters/min via Patient connected to nasal cannula oxygen  DISCHARGE LOCATION:  home   If you experience worsening of your admission symptoms, develop shortness of breath, life threatening emergency, suicidal or homicidal thoughts you must seek medical attention immediately by calling 911 or calling your MD immediately  if symptoms less severe.  You Must read complete instructions/literature along with all the possible adverse reactions/side effects for all the Medicines you take and that have been prescribed to you. Take any new Medicines after you have completely understood and accpet all the possible adverse reactions/side effects.   Please note  You were cared for by a hospitalist during your hospital stay. If you have any questions about your discharge medications or the care you received while you were in the hospital after you are discharged, you can call the unit and asked to speak with the hospitalist on call if the hospitalist that took care of you is not available. Once you are discharged, your primary care physician will handle any further medical issues. Please note that NO REFILLS for any discharge medications will be authorized once you are discharged, as it is imperative that you return to your primary care physician (or establish a relationship with a primary care physician if you do not have one) for your aftercare needs so that they can reassess your need for medications and monitor your lab values.

## 2015-06-24 NOTE — Progress Notes (Signed)
Pt d/c to home today w/home Oxygen.  IV removed intact.  Pt d/c instructions reviewed and all questions and concerns addressed.  Rx printed and given to patient.  Medication Education printed on D/C paperwork.  Wife at bedside to transport home.  Volunteer escorted pt out to parking lot.

## 2015-06-25 NOTE — Discharge Summary (Signed)
[facility] [place]   PATIENT NAME: Isaac Gould    MR#:  [id number]  DATE OF BIRTH:  01/25/1936  DATE OF ADMISSION:  06/19/2015 ADMITTING PHYSICIAN: Hillary Bow, MD  DATE OF DISCHARGE: 06/24/2015  PRIMARY CARE PHYSICIAN: No primary care provider on file.    ADMISSION DIAGNOSIS:  Pneumonia, organism unspecified [J18.9] SOB (shortness of breath) [R06.02] Failure of outpatient treatment [Z78.9] Sepsis, due to unspecified organism [A41.9]  DISCHARGE DIAGNOSIS:  Principal Problem:   Pulmonary emboli Active Problems:   Acute deep vein thrombosis (DVT) of distal vein of right lower extremity   Pneumonia, organism unspecified   SOB (shortness of breath)   Left ventricular hypertrophy by electrocardiogram   SECONDARY DIAGNOSIS:   Past Medical History  Diagnosis Date  . Coronary artery disease   . Hypertension   . Other abnormality of brain or central nervous system function study     poly \\rheumatic  myalgia  . BPH (benign prostatic hyperplasia)   . Polymyalgia rheumatica   . Rosacea     HOSPITAL COURSE:    1) acute submassive PE and right leg DVT: Initially presented with shortness of breath after failed outpatient treatment of pneumonia. CT angiogram of the chest showed multiple PEs and he has also had a right leg DVT. Etiology is unclear no long distance travel. He has been seen by hematology oncology and will follow-up with them as an outpatient for hypercoagulability workup. He does have polymyalgia rheumatica which could contribute to risk for thromboembolism. He was initially treated with 48 hours of Lovenox therapy and then converted to Eliquis. He has been given a 1 month coupon for Eliquis and been preauthorized for additional Eliquis through his insurance company.  2) CAP: Possible community-acquired pneumonia. He was treated with azithromycin and doxycycline during the hospitalization and is discharged on  azithromycin. Cultures negative. No fevers or sputum production at the time of discharge.  3) polymyalgia rheumatica: Continue prednisone as he had been on prior to this admission. No changes in dose. No signs of PMR flare during admission.  4) left ventricular hypertrophy: [place] cardiology consultation, no further recommendations. No congestive heart failure at this time. Will follow up with primary care. He does not have a history of uncontrolled hypertension.  #5 possible sleep apnea: In monitoring his oxygen saturations was noted that he has nocturnal hypoxia. Oxygen saturations during the day maintained in the 90s without supplemental oxygenation both at rest and with ambulation. At night he had episodes of hypoxia into the 80% range each night. He has been discharged with home oxygen and also with overnight pulse oximetry testing per home health. He will need to follow-up with his primary care provider for a formal sleep study. He does not have a known history of sleep apnea but he does snore.   DISCHARGE CONDITIONS:   Fair  CONSULTS OBTAINED:  Treatment Team:  Lloyd Huger, MD Corey Skains, MD  DRUG ALLERGIES:   Allergies  Allergen Reactions  . Beta Adrenergic Blockers Other (See Comments)    Reaction:  Drops pts pulse rate  . Cozaar [Losartan Potassium] Other (See Comments)    Reaction:  Cramps   . Indomethacin Other (See Comments)    Reaction:  Bleeding     DISCHARGE MEDICATIONS:   Discharge Medication List as of 06/24/2015  1:49 PM    START taking these medications   Details  !! apixaban (ELIQUIS) 5 MG TABS tablet Take 1 tablet (  5 mg total) by mouth 2 (two) times daily., Starting 06/28/2015, Until Discontinued, Print    !! apixaban (ELIQUIS) 5 MG TABS tablet Take 2 tablets (10 mg total) by mouth 2 (two) times daily., Starting 06/24/2015, Until Mon 06/30/15, Print    azithromycin (ZITHROMAX) 250 MG tablet One tablet daily, Print    benzonatate (TESSALON) 200  MG capsule Take 1 capsule (200 mg total) by mouth 3 (three) times daily., Starting 06/24/2015, Until Discontinued, Normal    famotidine (PEPCID) 20 MG tablet Take 1 tablet (20 mg total) by mouth daily., Starting 06/24/2015, Until Discontinued, Normal    guaiFENesin (MUCINEX) 600 MG 12 hr tablet Take 1 tablet (600 mg total) by mouth 2 (two) times daily., Starting 06/24/2015, Until Discontinued, Normal     !! - Potential duplicate medications found. Please discuss with provider.    CONTINUE these medications which have NOT CHANGED   Details  aspirin EC 81 MG tablet Take 81 mg by mouth at bedtime., Until Discontinued, Historical Med    candesartan (ATACAND) 4 MG tablet Take 4 mg by mouth daily., Until Discontinued, Historical Med    doxazosin (CARDURA) 2 MG tablet Take 2 mg by mouth at bedtime., Until Discontinued, Historical Med    doxycycline (VIBRAMYCIN) 50 MG capsule Take 50 mg by mouth daily., Until Discontinued, Historical Med    Multiple Vitamins-Minerals (CENTRUM SILVER PO) Take 1 tablet by mouth daily., Until Discontinued, Historical Med    Omega-3 Fatty Acids (FISH OIL) 1000 MG CAPS Take 1 capsule by mouth 2 (two) times daily., Until Discontinued, Historical Med    predniSONE (DELTASONE) 5 MG tablet Take 2.5-5 mg by mouth 2 (two) times daily. Pt takes one tablet in the morning and one-half tablet in the evening., Until Discontinued, Historical Med    simvastatin (ZOCOR) 40 MG tablet Take 40 mg by mouth at bedtime., Until Discontinued, Historical Med      STOP taking these medications     cefUROXime (CEFTIN) 250 MG tablet          DISCHARGE INSTRUCTIONS:   Please contact your physician for any increase in shortness of breath or difficulty breathing, increase in cough, or any other questions or concerns.   DIET:  Regular diet  DISCHARGE CONDITION:  Fair  ACTIVITY:  Activity as tolerated  OXYGEN:  Home Oxygen: Yes.     Oxygen Delivery: 2 liters/min via Patient  connected to nasal cannula oxygen  DISCHARGE LOCATION:  home   If you experience worsening of your admission symptoms, develop shortness of breath, life threatening emergency, suicidal or homicidal thoughts you must seek medical attention immediately by calling 911 or calling your MD immediately  if symptoms less severe.  You Must read complete instructions/literature along with all the possible adverse reactions/side effects for all the Medicines you take and that have been prescribed to you. Take any new Medicines after you have completely understood and accpet all the possible adverse reactions/side effects.   Please note  You were cared for by a hospitalist during your hospital stay. If you have any questions about your discharge medications or the care you received while you were in the hospital after you are discharged, you can call the unit and asked to speak with the hospitalist on call if the hospitalist that took care of you is not available. Once you are discharged, your primary care physician will handle any further medical issues. Please note that NO REFILLS for any discharge medications will be authorized once you are discharged,  as it is imperative that you return to your primary care physician (or establish a relationship with a primary care physician if you do not have one) for your aftercare needs so that they can reassess your need for medications and monitor your lab values.  Today   CHIEF COMPLAINT:   Chief Complaint  Patient presents with  . Shortness of Breath    HISTORY OF PRESENT ILLNESS:  Markees Carns is a 79 y.o. male with a known history of hypertension, CAD, rosacea on chronic doxycycline presents to the emergency room with worsening shortness of breath and cough with green sputum. Patient was seen by his primary care physician and treated for mild pneumonia with cefuroxime times as outpatient. Patient has taken his antibiotics for 8 days today and with worsening  symptoms present to the emergency room. His oxygen saturations were noted to be 87% on room air and a chest x-ray showing bilateral mild pneumonitis with a small left pleural effusion. A CT scan of the chest has been ordered and is pending. Being admitted as inpatient for IV antibiotics for failed outpatient therapy of pneumonia. No chest pain, fever, nausea, vomiting, abdominal pain, dysuria, diarrhea. No orthopnea. No edema. No sick contacts.  VITAL SIGNS:  Blood pressure 116/76, pulse 90, temperature 98.1 F (36.7 C), temperature source Oral, resp. rate 17, height 5\' 8"  (1.727 m), weight 94.802 kg (209 lb), SpO2 95 %.  I/O:  No intake or output data in the 24 hours ending 06/25/15 1521  PHYSICAL EXAMINATION:  GENERAL:  79 y.o.-year-old patient sitting up in the chair, no distress EYES: Pupils equal, round, reactive to light and accommodation. No scleral icterus. Extraocular muscles intact.  HEENT: Head atraumatic, normocephalic. Oropharynx and nasopharynx clear.  NECK:  Supple, no jugular venous distention. No thyroid enlargement, no tenderness.  LUNGS: Normal breath sounds bilaterally, no wheezing, rales, rhonchi or crepitation. No use of accessory muscles of respiration. Good air movement CARDIOVASCULAR: S1, S2 normal. No murmurs, rubs, or gallops.  ABDOMEN: Soft, non-tender, non-distended. Bowel sounds present. No organomegaly or mass.  EXTREMITIES: No pedal edema, cyanosis, or clubbing.  NEUROLOGIC: Cranial nerves II through XII are intact. Muscle strength 5/5 in all extremities. Sensation intact. Gait is normal PSYCHIATRIC: The patient is alert and oriented x 3.  SKIN: No obvious rash, lesion, or ulcer.   DATA REVIEW:   CBC  Recent Labs Lab 06/23/15 0654  WBC 12.7*  HGB 13.0  HCT 40.0  PLT 191    Chemistries   Recent Labs Lab 06/23/15 0654  NA 139  K 3.7  CL 106  CO2 26  GLUCOSE 96  BUN 18  CREATININE 1.04  CALCIUM 8.4*    Cardiac Enzymes No results for  input(s): TROPONINI in the last 168 hours.  Microbiology Results  Results for orders placed or performed during the hospital encounter of 06/19/15  Culture, blood (routine x 2)     Status: None   Collection Time: 06/19/15  1:44 PM  Result Value Ref Range Status   Specimen Description BLOOD LEFT ASSIST CONTROL  Final   Special Requests BOTTLES DRAWN AEROBIC AND ANAEROBIC 3CC  Final   Culture NO GROWTH 5 DAYS  Final   Report Status 06/24/2015 FINAL  Final  Culture, blood (routine x 2)     Status: None   Collection Time: 06/19/15  2:00 PM  Result Value Ref Range Status   Specimen Description BLOOD RIGHT ASSIST CONTROL  Final   Special Requests BOTTLES DRAWN AEROBIC AND ANAEROBIC  [place]  Final   Culture NO GROWTH 5 DAYS  Final   Report Status 06/24/2015 FINAL  Final  Culture, sputum-assessment     Status: None   Collection Time: 06/22/15  9:14 AM  Result Value Ref Range Status   Specimen Description EXPECTORATED SPUTUM  Final   Special Requests NONE  Final   Sputum evaluation THIS SPECIMEN IS ACCEPTABLE FOR SPUTUM CULTURE  Final   Report Status 06/22/2015 FINAL  Final  Culture, respiratory (NON-Expectorated)     Status: None (Preliminary result)   Collection Time: 06/22/15  9:14 AM  Result Value Ref Range Status   Specimen Description EXPECTORATED SPUTUM  Final   Special Requests NONE Reflexed from [id number]  Final   Gram Stain   Final    GOOD SPECIMEN - 80-90% WBCS FEW WBC SEEN FEW YEAST MODERATE GRAM POSITIVE COCCI IN PAIRS    Culture   Final    MODERATE GROWTH GRAM NEGATIVE RODS IDENTIFICATION TO FOLLOW    Report Status PENDING  Incomplete    RADIOLOGY:  No results found.  EKG:   Orders placed or performed during the hospital encounter of 06/19/15  . ED EKG  . ED EKG  . ED EKG  . ED EKG  . EKG 12-Lead  . EKG 12-Lead  . EKG      Management plans discussed with the patient, family and they are in agreement.  CODE STATUS: Full  TOTAL TIME TAKING CARE OF THIS  PATIENT: 45 minutes.  Greater than 50% of time spent in care coordination and counseling.  Myrtis Ser M.D on 06/25/2015 at 3:21 PM  Between 7am to 6pm - Pager - [phone]  After 6pm go to [email or web address] - password [email or web address] [facility]  [facility]  Office  [phone]  CC: Primary care physician; No primary care provider on file.    \

## 2015-06-26 LAB — CULTURE, RESPIRATORY W GRAM STAIN

## 2015-06-26 LAB — CULTURE, RESPIRATORY

## 2015-06-27 LAB — PROTHROMBIN GENE MUTATION

## 2015-07-08 ENCOUNTER — Inpatient Hospital Stay: Payer: Medicare Other | Attending: Oncology | Admitting: Oncology

## 2015-07-08 ENCOUNTER — Ambulatory Visit: Payer: Medicare Other

## 2015-07-08 ENCOUNTER — Inpatient Hospital Stay: Payer: Medicare Other

## 2015-07-08 ENCOUNTER — Encounter: Payer: Self-pay | Admitting: Oncology

## 2015-07-08 VITALS — BP 115/78 | HR 70 | Temp 97.5°F | Resp 18 | Ht 69.29 in | Wt 205.0 lb

## 2015-07-08 DIAGNOSIS — I2510 Atherosclerotic heart disease of native coronary artery without angina pectoris: Secondary | ICD-10-CM | POA: Insufficient documentation

## 2015-07-08 DIAGNOSIS — Z7901 Long term (current) use of anticoagulants: Secondary | ICD-10-CM | POA: Insufficient documentation

## 2015-07-08 DIAGNOSIS — Z79899 Other long term (current) drug therapy: Secondary | ICD-10-CM | POA: Diagnosis not present

## 2015-07-08 DIAGNOSIS — I2699 Other pulmonary embolism without acute cor pulmonale: Secondary | ICD-10-CM

## 2015-07-08 DIAGNOSIS — N400 Enlarged prostate without lower urinary tract symptoms: Secondary | ICD-10-CM | POA: Diagnosis not present

## 2015-07-08 DIAGNOSIS — I10 Essential (primary) hypertension: Secondary | ICD-10-CM | POA: Insufficient documentation

## 2015-07-08 DIAGNOSIS — Z7982 Long term (current) use of aspirin: Secondary | ICD-10-CM | POA: Insufficient documentation

## 2015-07-08 DIAGNOSIS — Z7952 Long term (current) use of systemic steroids: Secondary | ICD-10-CM | POA: Insufficient documentation

## 2015-07-11 LAB — CARDIOLIPIN ANTIBODIES, IGG, IGM, IGA

## 2015-07-11 LAB — PROTEIN S, TOTAL: Protein S Ag, Total: 140 % (ref 58–150)

## 2015-07-11 LAB — BETA-2-GLYCOPROTEIN I ABS, IGG/M/A: Beta-2 Glyco I IgG: <9 GPI IgG units (ref 0–20)

## 2015-07-11 LAB — FACTOR 5 LEIDEN

## 2015-07-11 LAB — PROTEIN S ACTIVITY: Protein S Activity: 142 % (ref 60–145)

## 2015-07-11 LAB — LUPUS ANTICOAGULANT PANEL
DRVVT: 45.4 s (ref 0.0–55.1)
PTT LA: 37.1 s (ref 0.0–50.0)

## 2015-07-11 NOTE — Progress Notes (Signed)
[place]  [phone] [phone]  ID: Isaac Gould OB: [birth year]  MR#: [id number]  SAY#:[id number]  No care team member to display  CHIEF COMPLAINT:  Chief Complaint  Patient presents with  . New Evaluation    hospital f/u for Pulmonary embolism/DVT    INTERVAL HISTORY: Patient is a 79 year old male who was initially evaluated in hospital after noting increasing shortness of breath. Despite a course of antibiotics for presumed pneumonia, his symptoms continued to get worse. Subsequent evaluation emergency room included CT scan which revealed bilateral pulmonary embolism. Currently, he feels well. He denies any further shortness of breath or chest pain.  He has no neurologic complaints. He denies any recent fevers. He has a good appetite and denies weight loss. He denies any pain. He denies any nausea, vomiting, constipation, or diarrhea. He has no urinary complaints. Patient offers no further specific complaints.  REVIEW OF SYSTEMS:   Review of Systems  Constitutional: Negative.   Respiratory: Negative for shortness of breath.   Cardiovascular: Negative.  Negative for chest pain.  Endo/Heme/Allergies: Does not bruise/bleed easily.    As per HPI. Otherwise, a complete review of systems is negatve.  PAST MEDICAL HISTORY: Past Medical History  Diagnosis Date  . Coronary artery disease   . Hypertension   . Other abnormality of brain or central nervous system function study     poly \\rheumatic  myalgia  . BPH (benign prostatic hyperplasia)   . Polymyalgia rheumatica   . Rosacea   . Pulmonary embolism     PAST SURGICAL HISTORY: Past Surgical History  Procedure Laterality Date  . Appendectomy      FAMILY HISTORY No family history on file.     ADVANCED DIRECTIVES:    HEALTH MAINTENANCE: Social History  Substance Use Topics  . Smoking status: Never Smoker   . Smokeless tobacco: Never Used  . Alcohol Use: No      Colonoscopy:  PAP:  Bone density:  Lipid panel:  Allergies  Allergen Reactions  . Beta Adrenergic Blockers Other (See Comments)    Reaction:  Drops pts pulse rate  . Cozaar [Losartan Potassium] Other (See Comments)    Reaction:  Cramps   . Indomethacin Other (See Comments)    Reaction:  Bleeding     Current Outpatient Prescriptions  Medication Sig Dispense Refill  . apixaban (ELIQUIS) 5 MG TABS tablet Take 1 tablet (5 mg total) by mouth 2 (two) times daily. 60 tablet 6  . aspirin EC 81 MG tablet Take 81 mg by mouth at bedtime.    . candesartan (ATACAND) 4 MG tablet Take 4 mg by mouth daily.    Marland Kitchen doxazosin (CARDURA) 2 MG tablet Take 2 mg by mouth at bedtime.    Marland Kitchen doxycycline (VIBRAMYCIN) 50 MG capsule Take 50 mg by mouth daily.    . Multiple Vitamins-Minerals (CENTRUM SILVER PO) Take 1 tablet by mouth daily.    . Omega-3 Fatty Acids (FISH OIL) 1000 MG CAPS Take 1 capsule by mouth 2 (two) times daily.    . predniSONE (DELTASONE) 5 MG tablet Take 2.5-5 mg by mouth 2 (two) times daily. Pt takes one tablet in the morning and one-half tablet in the evening.    . simvastatin (ZOCOR) 40 MG tablet Take 40 mg by mouth at bedtime.     No current facility-administered medications for this visit.    OBJECTIVE: Filed Vitals:   07/08/15 1016  BP: 115/78  Pulse: 70  Temp: 97.5 F (36.4  C)  Resp: 18     Body mass index is 30.02 kg/(m^2).    ECOG FS:0 - Asymptomatic  General: Well-developed, well-nourished, no acute distress. Eyes: Pink conjunctiva, anicteric sclera. Lungs: Clear to auscultation bilaterally. Heart: Regular rate and rhythm. No rubs, murmurs, or gallops. Abdomen: Soft, nontender, nondistended. No organomegaly noted, normoactive bowel sounds. Musculoskeletal: No edema, cyanosis, or clubbing. Neuro: Alert, answering all questions appropriately. Cranial nerves grossly intact. Skin: No rashes or petechiae noted. Psych: Normal affect.   LAB RESULTS:  Lab Results   Component Value Date   NA 139 06/23/2015   K 3.7 06/23/2015   CL 106 06/23/2015   CO2 26 06/23/2015   GLUCOSE 96 06/23/2015   BUN 18 06/23/2015   CREATININE 1.04 06/23/2015   CALCIUM 8.4* 06/23/2015   GFRNONAA >60 06/23/2015   GFRAA >60 06/23/2015    Lab Results  Component Value Date   WBC 12.7* 06/23/2015   HGB 13.0 06/23/2015   HCT 40.0 06/23/2015   MCV 91.2 06/23/2015   PLT 191 06/23/2015     STUDIES: Dg Chest 2 View  06/19/2015   CLINICAL DATA:  Coughing for 2 weeks, productive sputum.  EXAM: CHEST  2 VIEW  COMPARISON:  Chest x-ray dated 02/08/2010.  FINDINGS: Cardiomegaly is stable. Overall cardiomediastinal silhouette is unchanged in size or configuration. Suspect age-related aortic ectasia. There is mild scarring/ fibrosis within each lung. Also noted is central pulmonary vascular congestion and probable mild bilateral interstitial edema which appears similar to the previous exam. There is a small left pleural effusion which appears to be new.  Degenerative changes and ankylosis are seen throughout the scoliotic thoracic spine. No acute osseous abnormality.  IMPRESSION: 1. Cardiomegaly, stable. 2. Central pulmonary vascular congestion and mild bilateral interstitial edema, likely on a background of mild chronic scarring/fibrosis, suggests mild volume overload/ congestive heart failure. Appearance is stable compared to a previous chest x-ray dated 02/08/2010 indicating a chronic versus recurrent mild congestive heart failure. 3. Small left pleural effusion.   Electronically Signed   By: Franki Cabot M.D.   On: 06/19/2015 13:41   Ct Angio Chest Pe W/cm &/or Wo Cm  06/19/2015   CLINICAL DATA:  Increased shortness of breath. Recent diagnosis of upper respiratory infection. On antibiotics.  EXAM: CT ANGIOGRAPHY CHEST WITH CONTRAST  TECHNIQUE: Multidetector CT imaging of the chest was performed using the standard protocol during bolus administration of intravenous contrast.  Multiplanar CT image reconstructions and MIPs were obtained to evaluate the vascular anatomy.  CONTRAST:  132mL OMNIPAQUE IOHEXOL 350 MG/ML SOLN  COMPARISON:  Two-view chest x-ray from the same day.  FINDINGS: Extensive pulmonary emboli are noted. And nonobstructive subtle embolus is present at the main pulmonary bifurcation. Extensive obstructing emboli are noted in the lower lobe segmental arteries bilaterally. There is diffuse embolus in the right middle lobe arteries as well as the lingular arteries. Scattered nonobstructive emboli are noted in the upper lobes bilaterally.  In the largest axial diameter of the right ventricle is 5.4 cm. The largest axial diameter of the left ventricle is 3.4 cm. There is no significant pericardial effusion. A small the left moderate pleural effusion is present. There is associated airspace disease.  Ground-glass attenuation is noted inferiorly in the lingula. More prominent consolidation is seen posteriorly in the lingula. No significant right-sided airspace disease is present.  Exaggerated thoracic kyphosis is present with fusion of anterior osteophytes across multiple levels. A remote T7 compression fracture is noted.  Review of the MIP images  confirms the above findings.  IMPRESSION: 1. Extensive pulmonary emboli in scattered throughout both lungs with a nonobstructive embolus extending across the main pulmonary artery bifurcation. Positive for acute PE with CT evidence of right heart strain (RV/LV Ratio = 1.6) consistent with at least submassive (intermediate risk) PE. The presence of right heart strain has been associated with an increased risk of morbidity and mortality. 2. Focal airspace disease posteriorly in the lingula is likely related to the emboli. 3. Small moderate left pleural effusion with associated atelectasis. 4. Exaggerated thoracic kyphosis with remote T7 compression fracture. Critical Value/emergent results were called by telephone at the time of  interpretation on 06/19/2015 at 2:47 pm to Dr. Carrie Mew , who verbally acknowledged these results.   Electronically Signed   By: San Morelle M.D.   On: 06/19/2015 14:48   US Venous Img Lower Bilateral  06/19/2015   CLINICAL DATA:  Dyspnea.  Bilateral pulmonary emboli by CT.  EXAM: BILATERAL LOWER EXTREMITY VENOUS DOPPLER ULTRASOUND  TECHNIQUE: Gray-scale sonography with graded compression, as well as color Doppler and duplex ultrasound were performed to evaluate the lower extremity deep venous systems from the level of the common femoral vein and including the common femoral, femoral, profunda femoral, popliteal and calf veins including the posterior tibial, peroneal and gastrocnemius veins when visible. The superficial great saphenous vein was also interrogated. Spectral Doppler was utilized to evaluate flow at rest and with distal augmentation maneuvers in the common femoral, femoral and popliteal veins.  COMPARISON:  None.  FINDINGS: RIGHT LOWER EXTREMITY  Common Femoral Vein: There is nonocclusive thrombus in the common femoral vein, noncompressible but with some flow getting past.  Saphenofemoral Junction: There is nonocclusive thrombus at the saphenous vein junction.  Profunda Femoral Vein: There is nonocclusive thrombus in the profunda femoral vein.  Femoral Vein: No evidence of thrombus. Normal compressibility, respiratory phasicity and response to augmentation.  Popliteal Vein: No evidence of thrombus. Normal compressibility, respiratory phasicity and response to augmentation.  Calf Veins: No evidence of thrombus. Normal compressibility and flow on color Doppler imaging.  Superficial Great Saphenous Vein: No evidence of thrombus. Normal compressibility and flow on color Doppler imaging.  Venous Reflux:  None.  Other Findings:  None.  LEFT LOWER EXTREMITY  Common Femoral Vein: No evidence of thrombus. Normal compressibility, respiratory phasicity and response to augmentation.   Saphenofemoral Junction: No evidence of thrombus. Normal compressibility and flow on color Doppler imaging.  Profunda Femoral Vein: No evidence of thrombus. Normal compressibility and flow on color Doppler imaging.  Femoral Vein: No evidence of thrombus. Normal compressibility, respiratory phasicity and response to augmentation.  Popliteal Vein: No evidence of thrombus. Normal compressibility, respiratory phasicity and response to augmentation.  Calf Veins: No evidence of thrombus. Normal compressibility and flow on color Doppler imaging.  Superficial Great Saphenous Vein: No evidence of thrombus. Normal compressibility and flow on color Doppler imaging.  Venous Reflux:  None.  Other Findings:  None.  IMPRESSION: The study is positive for deep venous thrombosis in the right lower extremity. There is nonocclusive thrombus proximally, involving the right common femoral vein, profunda femoral vein and saphenofemoral junction. No left-sided DVT.  These results will be called to the ordering clinician or representative by the Radiologist Assistant, and communication documented in the PACS or zVision Dashboard.   Electronically Signed   By: Andreas Newport M.D.   On: 06/19/2015 18:43    ASSESSMENT: Bilateral pulmonary embolism.  PLAN:    1. Pulmonary embolism/DVT: Patient did not  appear to have any transient risk factors and a full hypercoagulable workup has been initiated and is currently pending. Patient will require a minimum of 3 months of anticoagulation on Eliquis completing at the end of October.  Will send patient to lab today to complete the hypercoagulable workup and then return to clinic in mid October to discuss the results and determine if additional anticoagulation is necessary.  Patient expressed understanding and was in agreement with this plan. He also understands that He can call clinic at any time with any questions, concerns, or complaints.    Lloyd Huger, MD   07/11/2015 11:49  AM

## 2015-07-17 ENCOUNTER — Ambulatory Visit: Payer: Medicare Other | Attending: Specialist

## 2015-07-17 DIAGNOSIS — G4733 Obstructive sleep apnea (adult) (pediatric): Secondary | ICD-10-CM | POA: Diagnosis present

## 2015-09-09 ENCOUNTER — Other Ambulatory Visit: Payer: Self-pay | Admitting: Unknown Physician Specialty

## 2015-09-09 DIAGNOSIS — M79661 Pain in right lower leg: Secondary | ICD-10-CM

## 2015-09-10 ENCOUNTER — Inpatient Hospital Stay: Payer: Medicare Other

## 2015-09-10 ENCOUNTER — Ambulatory Visit
Admission: RE | Admit: 2015-09-10 | Discharge: 2015-09-10 | Disposition: A | Discharge status: Home / Self Care | Payer: Medicare Other | Source: Ambulatory Visit | Attending: Unknown Physician Specialty | Admitting: Unknown Physician Specialty

## 2015-09-10 ENCOUNTER — Inpatient Hospital Stay: Payer: Medicare Other | Attending: Oncology | Admitting: Oncology

## 2015-09-10 VITALS — BP 135/80 | HR 62 | Temp 98.0°F | Resp 16 | Wt 212.9 lb

## 2015-09-10 DIAGNOSIS — Z86711 Personal history of pulmonary embolism: Secondary | ICD-10-CM | POA: Insufficient documentation

## 2015-09-10 DIAGNOSIS — M79661 Pain in right lower leg: Secondary | ICD-10-CM | POA: Insufficient documentation

## 2015-09-10 DIAGNOSIS — M353 Polymyalgia rheumatica: Secondary | ICD-10-CM | POA: Diagnosis not present

## 2015-09-10 DIAGNOSIS — I2699 Other pulmonary embolism without acute cor pulmonale: Secondary | ICD-10-CM

## 2015-09-10 DIAGNOSIS — I2510 Atherosclerotic heart disease of native coronary artery without angina pectoris: Secondary | ICD-10-CM | POA: Diagnosis not present

## 2015-09-10 DIAGNOSIS — N400 Enlarged prostate without lower urinary tract symptoms: Secondary | ICD-10-CM | POA: Insufficient documentation

## 2015-09-10 DIAGNOSIS — I10 Essential (primary) hypertension: Secondary | ICD-10-CM | POA: Diagnosis not present

## 2015-09-10 DIAGNOSIS — Z7901 Long term (current) use of anticoagulants: Secondary | ICD-10-CM | POA: Insufficient documentation

## 2015-09-10 DIAGNOSIS — Z7952 Long term (current) use of systemic steroids: Secondary | ICD-10-CM | POA: Diagnosis not present

## 2015-09-10 DIAGNOSIS — Z79899 Other long term (current) drug therapy: Secondary | ICD-10-CM | POA: Insufficient documentation

## 2015-09-10 DIAGNOSIS — Z7982 Long term (current) use of aspirin: Secondary | ICD-10-CM | POA: Diagnosis not present

## 2015-09-10 LAB — FIBRIN DERIVATIVES D-DIMER (ARMC ONLY): Fibrin derivatives D-dimer (ARMC): 461 (ref 0–499)

## 2015-09-17 LAB — PROTHROMBIN GENE MUTATION

## 2015-09-24 NOTE — Progress Notes (Signed)
[place]  [phone] [phone]) [phone]  ID: Robyn Haber OB: 12/11/1935  MR#: [id number]  OHY#:[id number]  Patient Care Team: Madelyn Brunner, MD as PCP - General (Internal Medicine)  CHIEF COMPLAINT:  Chief Complaint  Patient presents with  . Follow-up    INTERVAL HISTORY: Patient returns to clinic today to discuss his hypercoagulable workup and consideration of discontinuing anticoagulation. He currently feels well and is asymptomatic. He denies any further shortness of breath or chest pain.  He has no neurologic complaints. He denies any recent fevers. He has a good appetite and denies weight loss. He denies any pain. He denies any nausea, vomiting, constipation, or diarrhea. He has no urinary complaints. Patient offers no specific complaints today.  REVIEW OF SYSTEMS:   Review of Systems  Constitutional: Negative.   Respiratory: Negative for shortness of breath.   Cardiovascular: Negative.  Negative for chest pain.  Gastrointestinal: Negative.   Musculoskeletal: Negative.   Neurological: Negative.   Endo/Heme/Allergies: Does not bruise/bleed easily.    As per HPI. Otherwise, a complete review of systems is negatve.  PAST MEDICAL HISTORY: Past Medical History  Diagnosis Date  . Coronary artery disease   . Hypertension   . Other abnormality of brain or central nervous system function study     poly \\rheumatic  myalgia  . BPH (benign prostatic hyperplasia)   . Polymyalgia rheumatica   . Rosacea   . Pulmonary embolism     PAST SURGICAL HISTORY: Past Surgical History  Procedure Laterality Date  . Appendectomy      FAMILY HISTORY No family history on file.     ADVANCED DIRECTIVES:    HEALTH MAINTENANCE: Social History  Substance Use Topics  . Smoking status: Never Smoker   . Smokeless tobacco: Never Used  . Alcohol Use: No     Colonoscopy:  PAP:  Bone density:  Lipid panel:  Allergies  Allergen Reactions  .  Beta Adrenergic Blockers Other (See Comments)    Reaction:  Drops pts pulse rate  . Cozaar [Losartan Potassium] Other (See Comments)    Reaction:  Cramps   . Indomethacin Other (See Comments)    Reaction:  Bleeding     Current Outpatient Prescriptions  Medication Sig Dispense Refill  . apixaban (ELIQUIS) 5 MG TABS tablet Take 1 tablet (5 mg total) by mouth 2 (two) times daily. 60 tablet 6  . aspirin EC 81 MG tablet Take 81 mg by mouth at bedtime.    Marland Kitchen doxazosin (CARDURA) 2 MG tablet Take 2 mg by mouth at bedtime.    Marland Kitchen doxycycline (VIBRAMYCIN) 50 MG capsule Take 50 mg by mouth daily.    . Multiple Vitamins-Minerals (CENTRUM SILVER PO) Take 1 tablet by mouth daily.    . Omega-3 Fatty Acids (FISH OIL) 1000 MG CAPS Take 1 capsule by mouth 2 (two) times daily.    . predniSONE (DELTASONE) 5 MG tablet Take 2.5-5 mg by mouth 2 (two) times daily. Pt takes one tablet in the morning and one-half tablet in the evening.    . simvastatin (ZOCOR) 40 MG tablet Take 40 mg by mouth at bedtime.    . candesartan (ATACAND) 4 MG tablet Take 4 mg by mouth daily.    Marland Kitchen HYDROcodone-acetaminophen (NORCO/VICODIN) 5-325 MG tablet Take 5-325 tablets by mouth.    . predniSONE (STERAPRED UNI-PAK 21 TAB) 10 MG (21) TBPK tablet Take 10 mg by mouth. Taper pack     No current facility-administered medications for this visit.  OBJECTIVE: Filed Vitals:   09/10/15 1124  BP: 135/80  Pulse: 62  Temp: 98 F (36.7 C)  Resp: 16     Body mass index is 31.17 kg/(m^2).    ECOG FS:0 - Asymptomatic  General: Well-developed, well-nourished, no acute distress. Eyes: Pink conjunctiva, anicteric sclera. Lungs: Clear to auscultation bilaterally. Heart: Regular rate and rhythm. No rubs, murmurs, or gallops. Abdomen: Soft, nontender, nondistended. No organomegaly noted, normoactive bowel sounds. Musculoskeletal: No edema, cyanosis, or clubbing. Neuro: Alert, answering all questions appropriately. Cranial nerves grossly  intact. Skin: No rashes or petechiae noted. Psych: Normal affect.   LAB RESULTS:  Lab Results  Component Value Date   NA 139 06/23/2015   K 3.7 06/23/2015   CL 106 06/23/2015   CO2 26 06/23/2015   GLUCOSE 96 06/23/2015   BUN 18 06/23/2015   CREATININE 1.04 06/23/2015   CALCIUM 8.4* 06/23/2015   GFRNONAA >60 06/23/2015   GFRAA >60 06/23/2015    Lab Results  Component Value Date   WBC 12.7* 06/23/2015   HGB 13.0 06/23/2015   HCT 40.0 06/23/2015   MCV 91.2 06/23/2015   PLT 191 06/23/2015     STUDIES: US Venous Img Lower Unilateral Right  09/10/2015  CLINICAL DATA:  79 year old male with a history of right calf pain for 3 days. EXAM: RIGHT LOWER EXTREMITY VENOUS DOPPLER ULTRASOUND TECHNIQUE: Gray-scale sonography with graded compression, as well as color Doppler and duplex ultrasound were performed to evaluate the lower extremity deep venous systems from the level of the common femoral vein and including the common femoral, femoral, profunda femoral, popliteal and calf veins including the posterior tibial, peroneal and gastrocnemius veins when visible. The superficial great saphenous vein was also interrogated. Spectral Doppler was utilized to evaluate flow at rest and with distal augmentation maneuvers in the common femoral, femoral and popliteal veins. COMPARISON:  06/19/2015 FINDINGS: Contralateral Common Femoral Vein: Respiratory phasicity is normal and symmetric with the symptomatic side. No evidence of thrombus. Normal compressibility. Common Femoral Vein: No evidence of thrombus. Normal compressibility, respiratory phasicity and response to augmentation. Saphenofemoral Junction: No evidence of thrombus. Normal compressibility and flow on color Doppler imaging. Profunda Femoral Vein: No evidence of thrombus. Normal compressibility and flow on color Doppler imaging. Femoral Vein: No evidence of thrombus. Normal compressibility, respiratory phasicity and response to augmentation.  Popliteal Vein: No evidence of thrombus. Normal compressibility, respiratory phasicity and response to augmentation. Calf Veins: No evidence of thrombus. Normal compressibility and flow on color Doppler imaging. Superficial Great Saphenous Vein: No evidence of thrombus. Normal compressibility and flow on color Doppler imaging. Other Findings:  None. IMPRESSION: Sonographic survey of the right lower extremity negative for DVT. Signed, Dulcy Fanny. Earleen Newport, DO [facility] Electronically Signed   By: Corrie Mckusick D.O.   On: 09/10/2015 10:23    ASSESSMENT: Bilateral pulmonary embolism.  PLAN:    1. Pulmonary embolism/DVT: Patient did not appear to have any transient risk factors and a full hypercoagulable workup is negative. Patient will complete 3 months of Eliquis at the end of October. See 2016 Chest Guidelines below. He has been instructed to discontinue anticoagulation at that time. No further intervention is needed. No follow-up is necessary.  "In patients with a proximal DVT of the leg or PE provoked by a nonsurgical transient risk factor, we recommend treatment with anticoagulation for 3 months over (i) treatment of a shorter period (Grade 1B) and (ii) treatment of a longer time-limited period (eg, 6, 12,  or 24 months) (Grade 1B). We suggest treatment with anticoagulation for 3 months over extended therapy if there is a low or moderate bleeding risk (Grade 2B), and recommend treatment for 3 months over extended therapy if there is a high risk of bleeding (Grade 1B)."   Patient expressed understanding and was in agreement with this plan. He also understands that He can call clinic at any time with any questions, concerns, or complaints.    Lloyd Huger, MD   09/24/2015 1:27 PM

## 2017-05-25 IMAGING — CT CT ANGIO CHEST
1 of 2 series · 18 of 30 positions shown · IV contrast (APPLIED)
Comparison: Two-view chest x-ray from the same day.

CLINICAL DATA: Increased shortness of breath. Recent diagnosis of
upper respiratory infection. On antibiotics.

EXAM:
CT ANGIOGRAPHY CHEST WITH CONTRAST
TECHNIQUE: Multidetector CT imaging of the chest was performed using the
standard protocol during bolus administration of intravenous
contrast. Multiplanar CT image reconstructions and MIPs were
obtained to evaluate the vascular anatomy.
CONTRAST:  100mL OMNIPAQUE IOHEXOL 350 MG/ML SOLN

[Series 13: pe 1.0 thins----- · axial · 0.85mm/px · z∈[-955,-683]mm · 18 of 308 slices shown]
[im 18/308  lung]
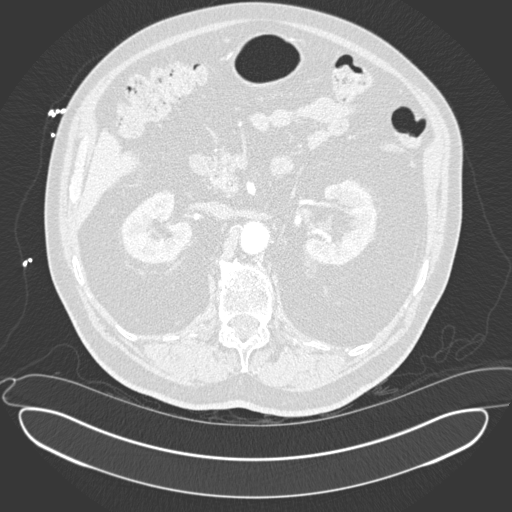
[im 35/308  mediastinal]
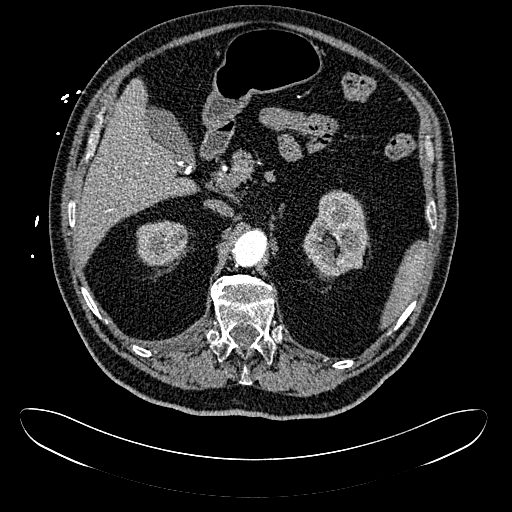
[im 52/308  lung]
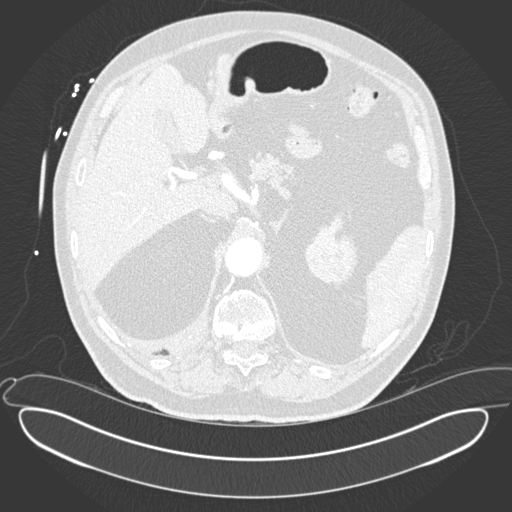
[im 69/308  mediastinal]
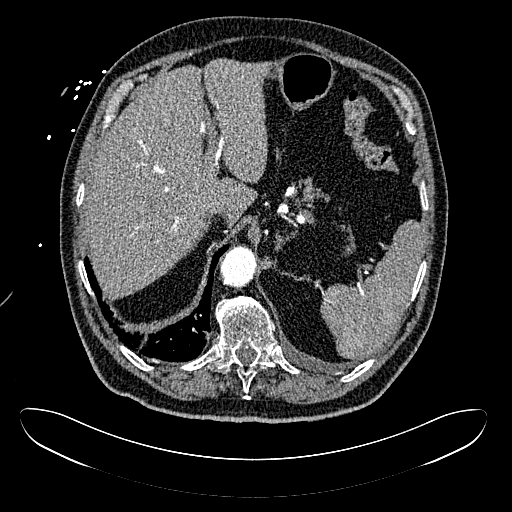
[im 86/308  lung]
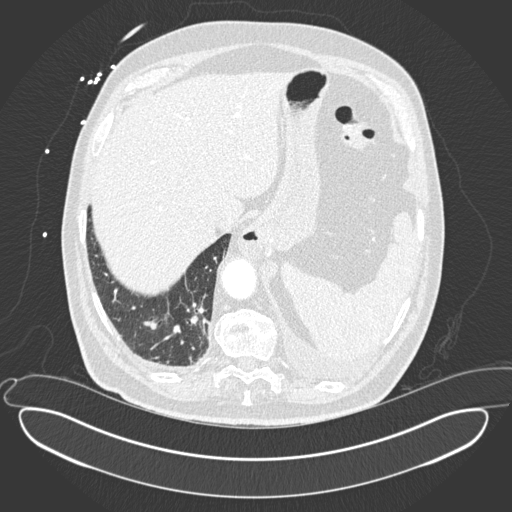
[im 103/308  mediastinal]
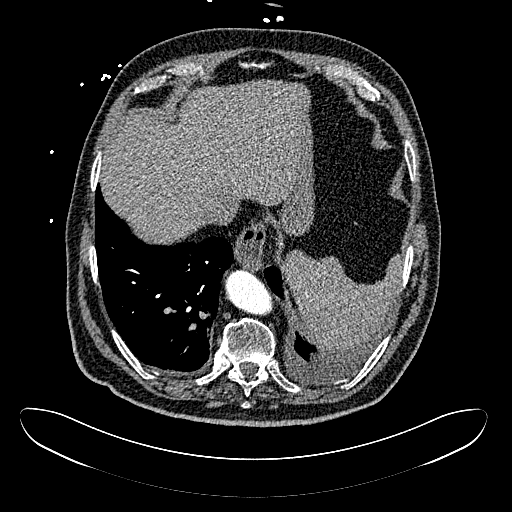
[im 120/308  lung]
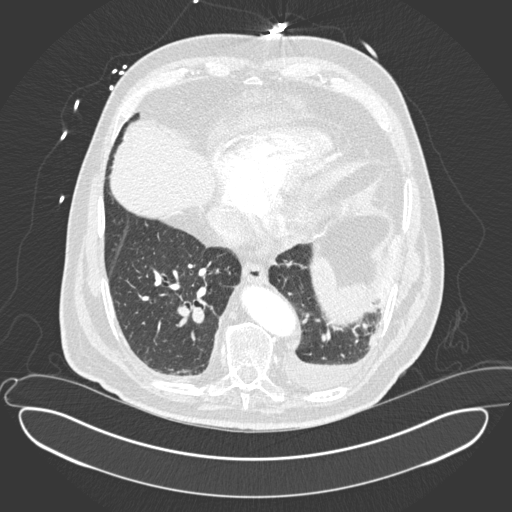
[im 137/308  mediastinal]
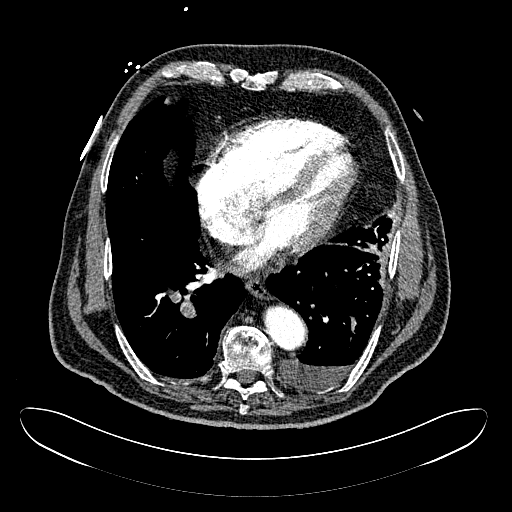
[im 145/308  lung]
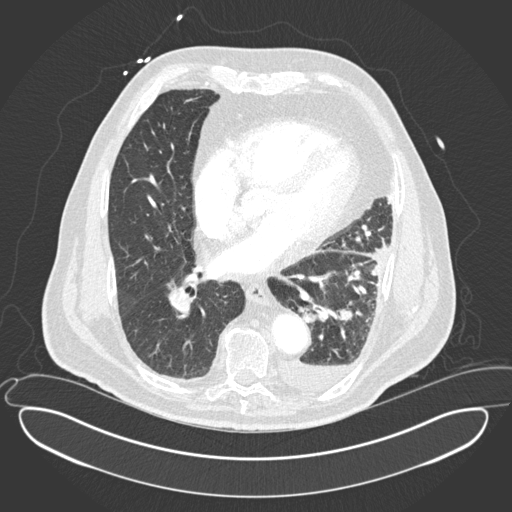
[im 154/308  mediastinal]
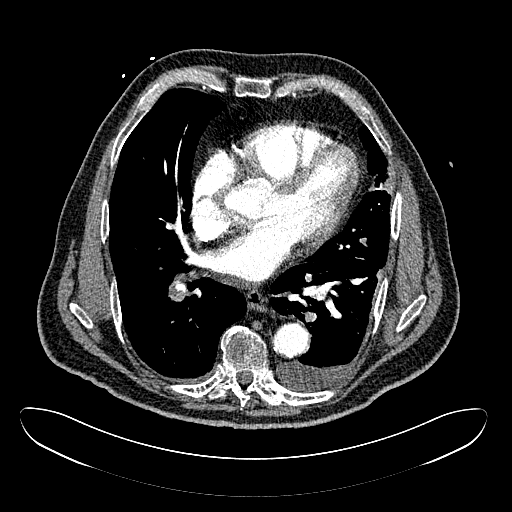
[im 171/308  lung]
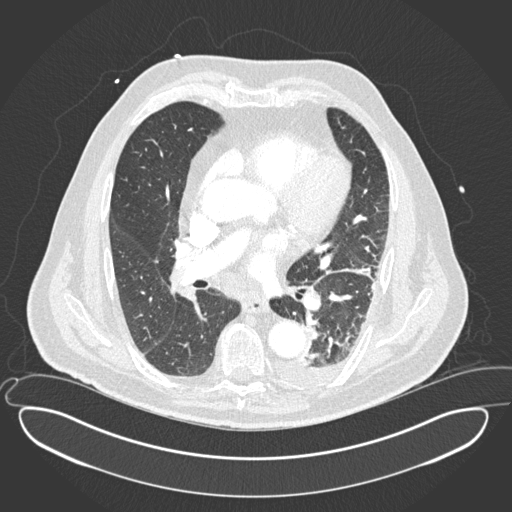
[im 188/308  mediastinal]
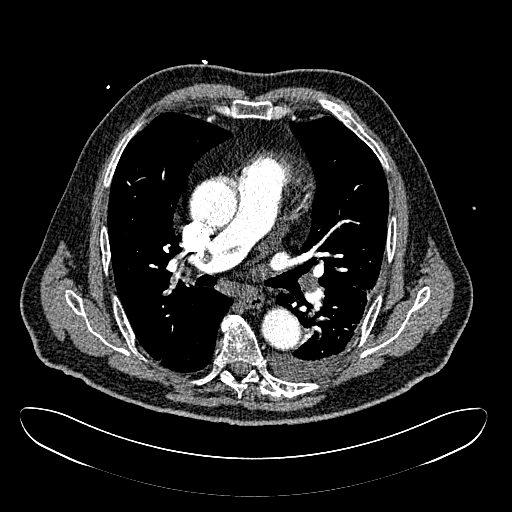
[im 205/308  lung]
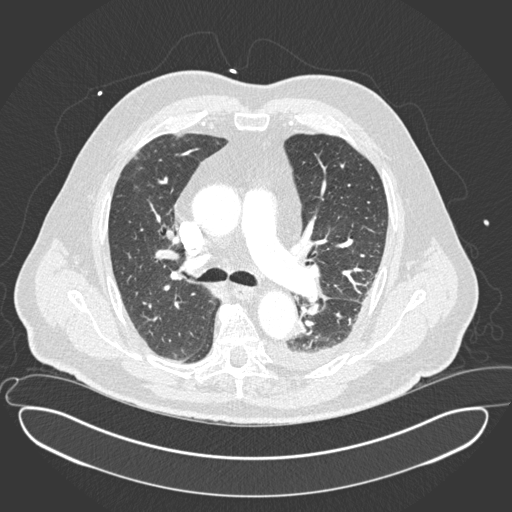
[im 222/308  mediastinal]
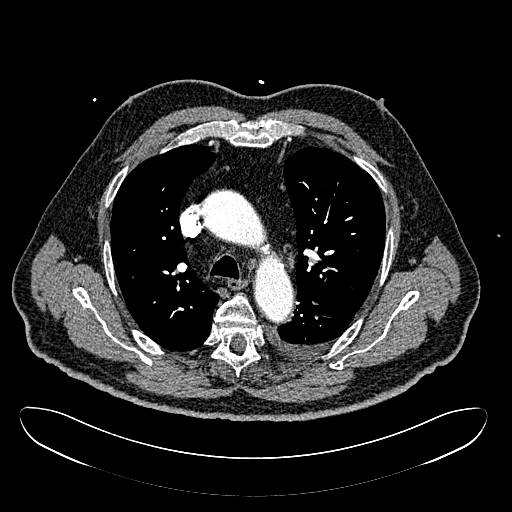
[im 239/308  lung]
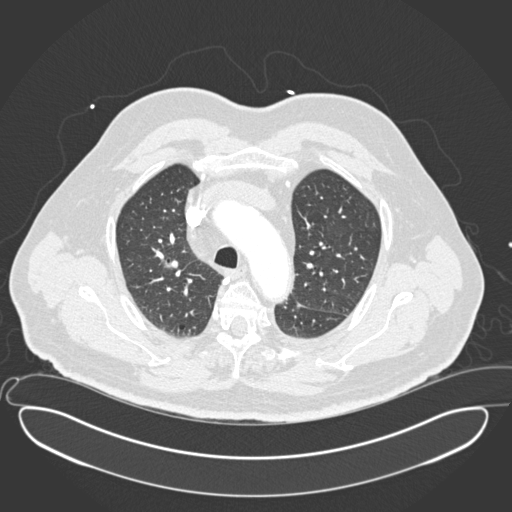
[im 256/308  mediastinal]
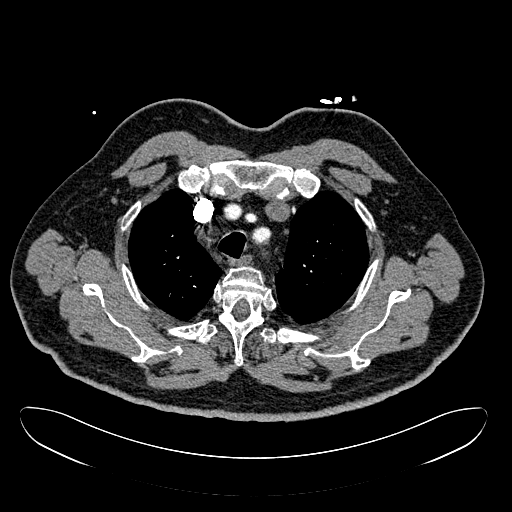
[im 273/308  lung]
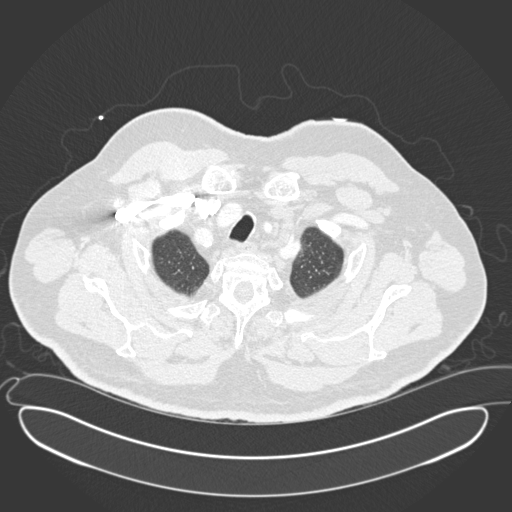
[im 290/308  mediastinal]
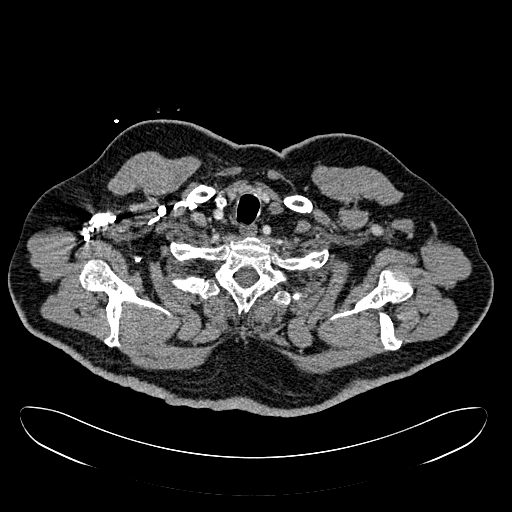

[18 of 30 positions shown; findings below may reference images not displayed]

FINDINGS: Extensive pulmonary emboli are noted. And nonobstructive subtle
embolus is present at the main pulmonary bifurcation. Extensive
obstructing emboli are noted in the lower lobe segmental arteries
bilaterally. There is diffuse embolus in the right middle lobe
arteries as well as the lingular arteries. Scattered nonobstructive
emboli are noted in the upper lobes bilaterally.

In the largest axial diameter of the right ventricle is 5.4 cm. The
largest axial diameter of the left ventricle is 3.4 cm. There is no
significant pericardial effusion. A small the left moderate pleural
effusion is present. There is associated airspace disease.

Ground-glass attenuation is noted inferiorly in the lingula. More
prominent consolidation is seen posteriorly in the lingula. No
significant right-sided airspace disease is present.

Exaggerated thoracic kyphosis is present with fusion of anterior
osteophytes across multiple levels. A remote T7 compression fracture
is noted.

Review of the MIP images confirms the above findings.
IMPRESSION: 1. Extensive pulmonary emboli in scattered throughout both lungs
with a nonobstructive embolus extending across the main pulmonary
artery bifurcation. Positive for acute PE with CT evidence of right
heart strain (RV/LV Ratio = 1.6) consistent with at least submassive
(intermediate risk) PE. The presence of right heart strain has been
associated with an increased risk of morbidity and mortality.
2. Focal airspace disease posteriorly in the lingula is likely
related to the emboli.
3. Small moderate left pleural effusion with associated atelectasis.
4. Exaggerated thoracic kyphosis with remote T7 compression
fracture.
Critical Value/emergent results were called by telephone at the time
of interpretation on 06/19/2015 at [DATE] to Dr. DAVIS JIM ,
who verbally acknowledged these results.

## 2017-05-25 IMAGING — CR DG CHEST 2V
2 series · 2 of 2 positions shown · non-contrast
Comparison: Chest x-ray dated 02/08/2010.

CLINICAL DATA: Coughing for 2 weeks, productive sputum.

EXAM:
CHEST  2 VIEW

[chest pa]
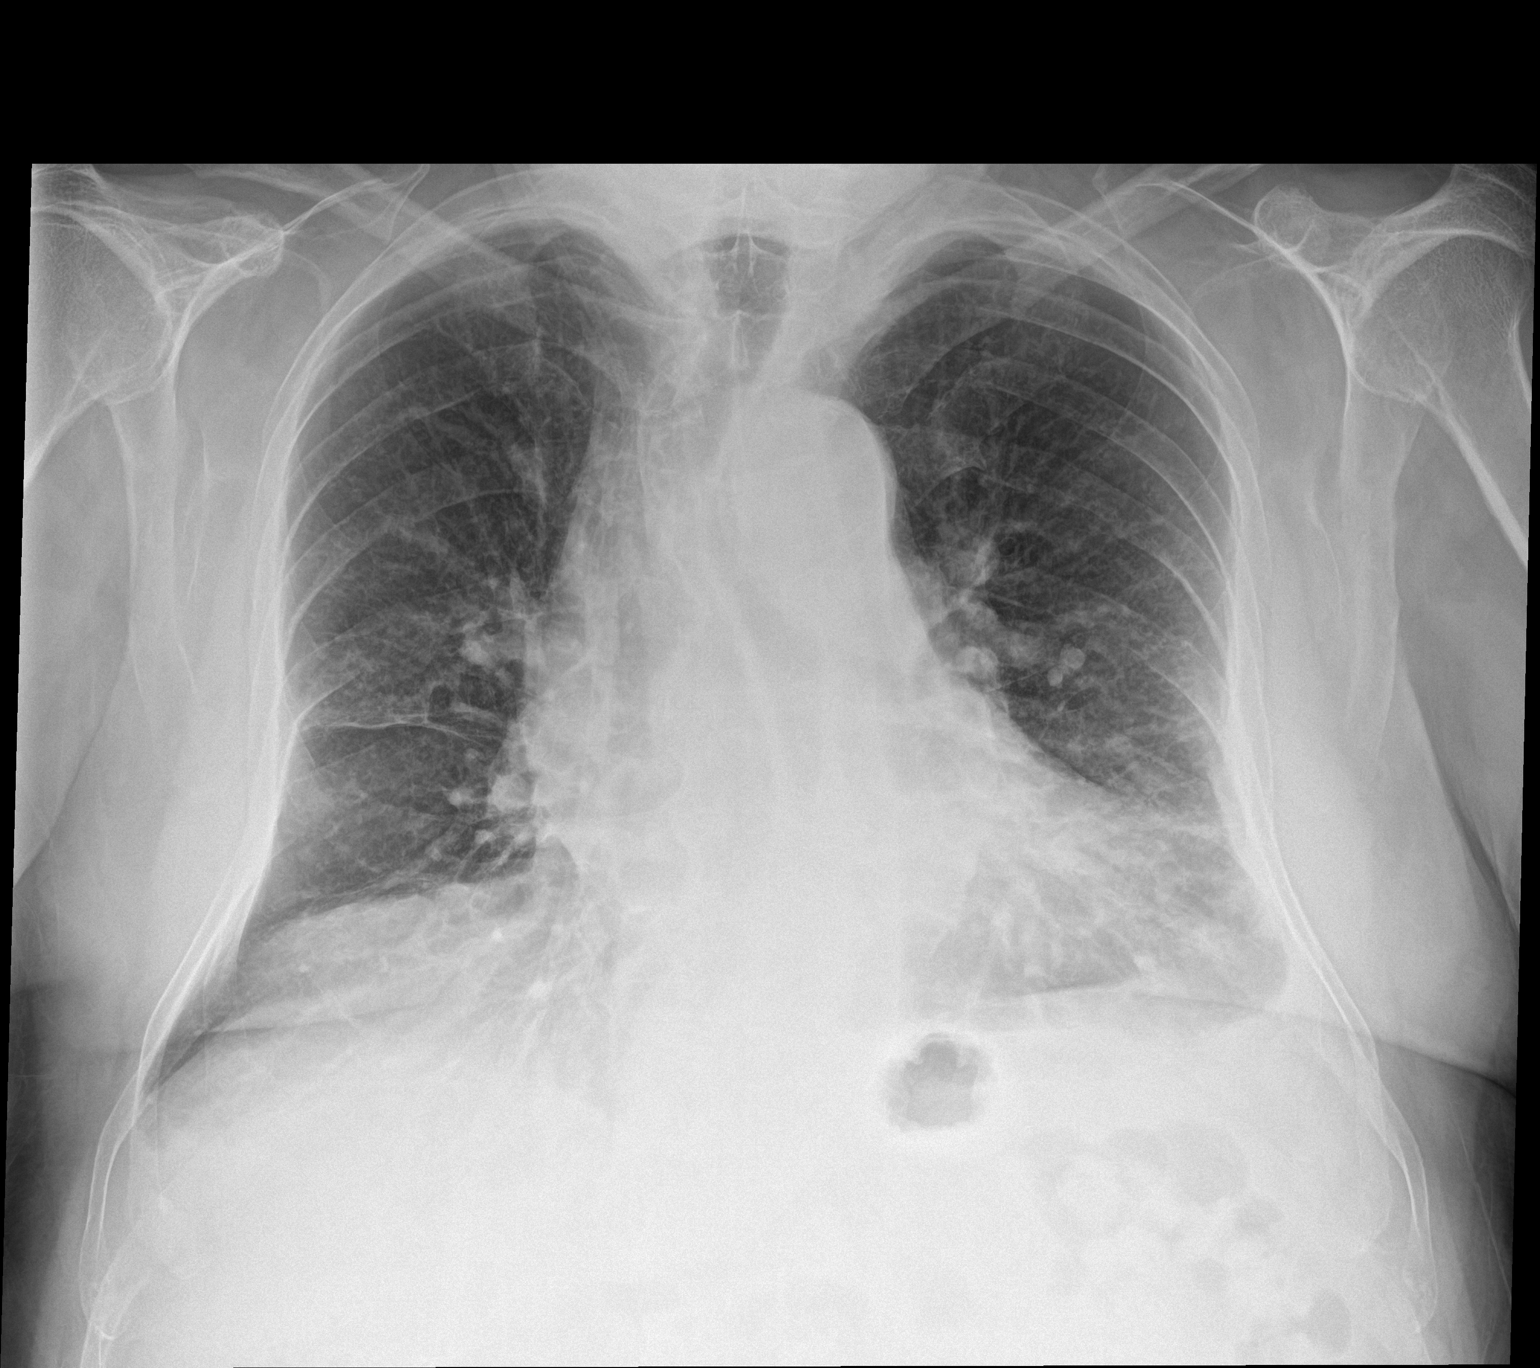

[chest lat]
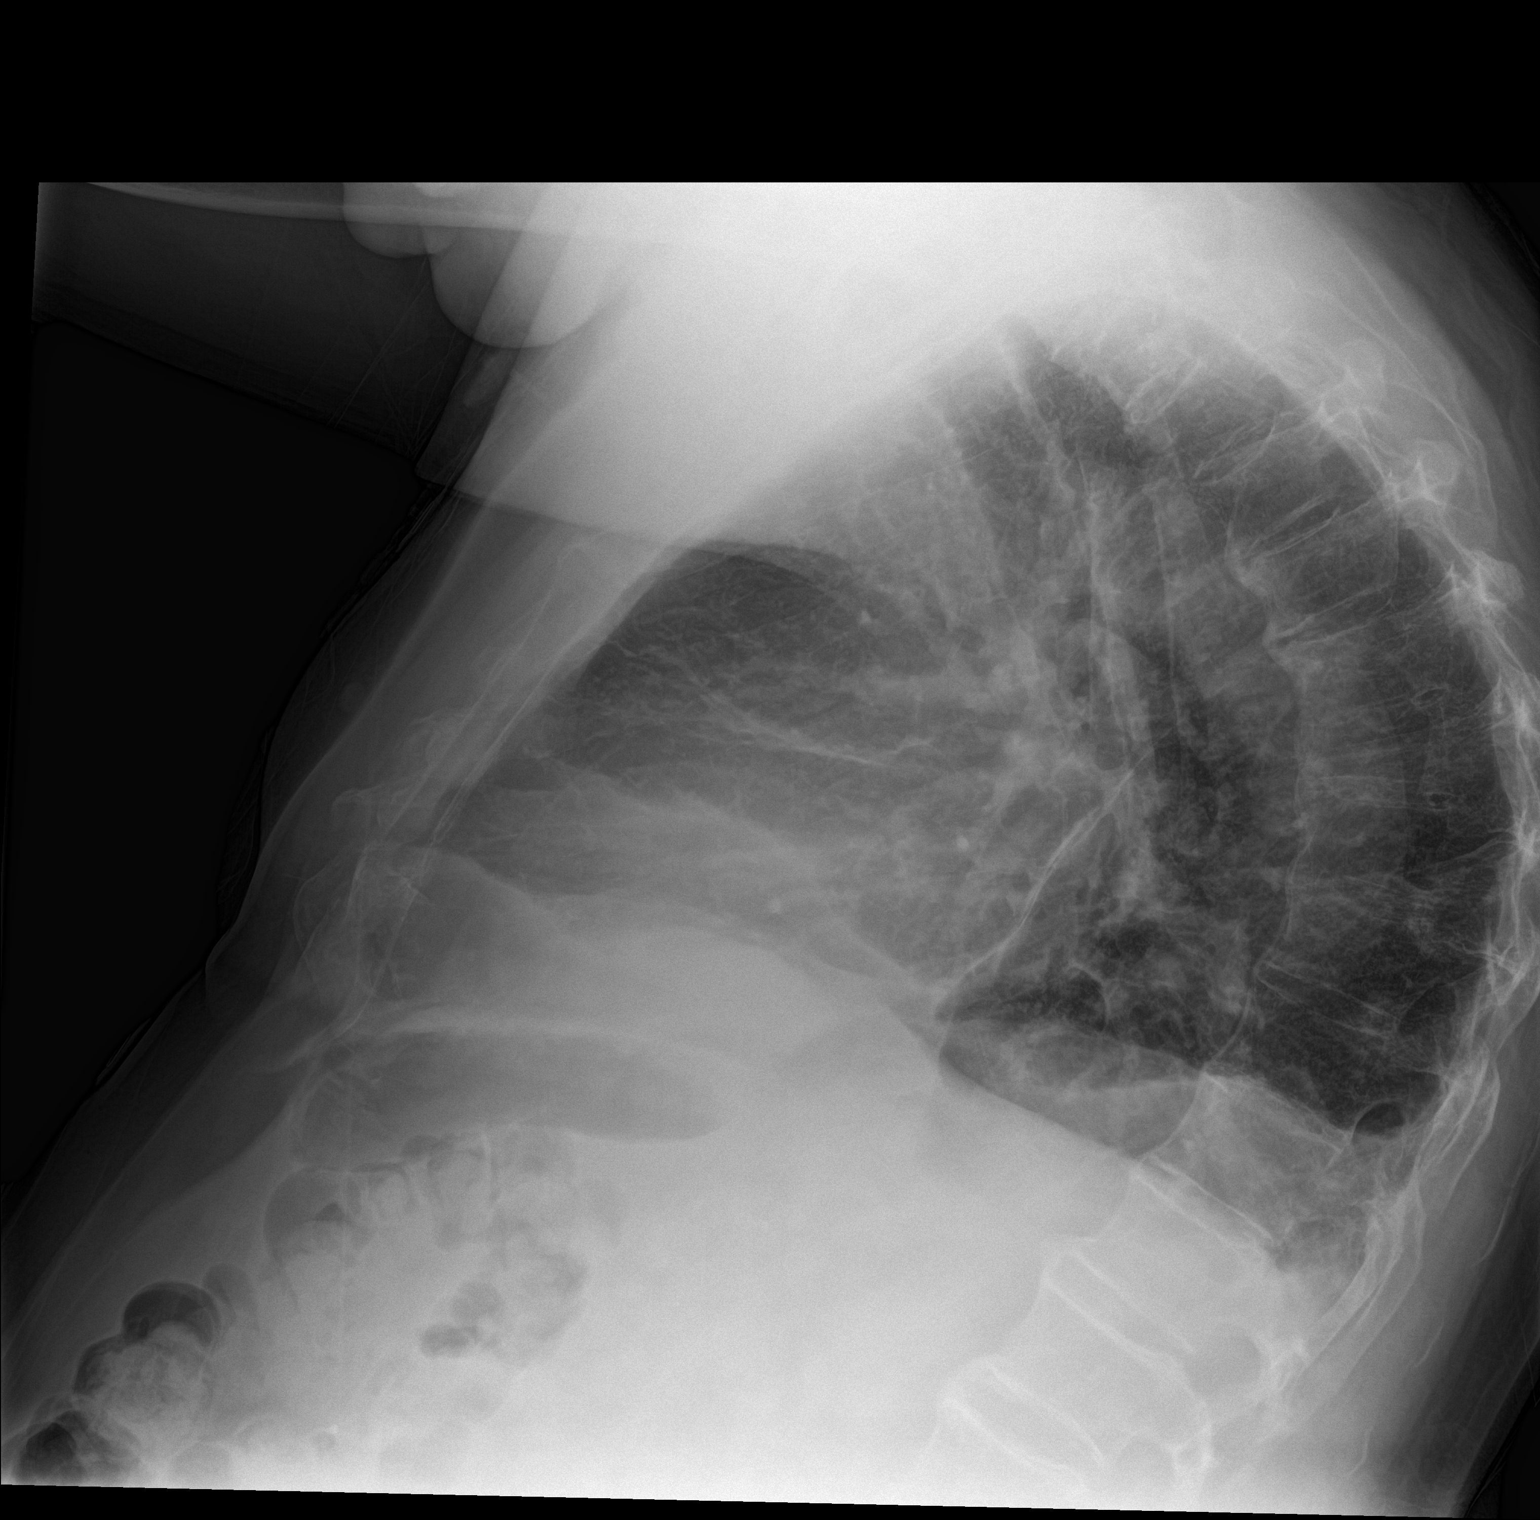

[2 of 2 positions shown; findings below may reference images not displayed]

FINDINGS: Cardiomegaly is stable. Overall cardiomediastinal silhouette is
unchanged in size or configuration. Suspect age-related aortic
ectasia. There is mild scarring/ fibrosis within each lung. Also
noted is central pulmonary vascular congestion and probable mild
bilateral interstitial edema which appears similar to the previous
exam. There is a small left pleural effusion which appears to be
new.

Degenerative changes and ankylosis are seen throughout the scoliotic
thoracic spine. No acute osseous abnormality.
IMPRESSION: 1. Cardiomegaly, stable.
2. Central pulmonary vascular congestion and mild bilateral
interstitial edema, likely on a background of mild chronic
scarring/fibrosis, suggests mild volume overload/ congestive heart
failure. Appearance is stable compared to a previous chest x-ray
dated 02/08/2010 indicating a chronic versus recurrent mild
congestive heart failure.
3. Small left pleural effusion.

## 2017-05-31 ENCOUNTER — Other Ambulatory Visit: Payer: Self-pay

## 2020-03-18 ENCOUNTER — Encounter: Payer: Self-pay | Admitting: Dermatology

## 2020-03-18 ENCOUNTER — Other Ambulatory Visit: Payer: Self-pay

## 2020-03-18 ENCOUNTER — Ambulatory Visit (INDEPENDENT_AMBULATORY_CARE_PROVIDER_SITE_OTHER): Payer: Medicare Other | Admitting: Dermatology

## 2020-03-18 DIAGNOSIS — Z86018 Personal history of other benign neoplasm: Secondary | ICD-10-CM

## 2020-03-18 DIAGNOSIS — I872 Venous insufficiency (chronic) (peripheral): Secondary | ICD-10-CM

## 2020-03-18 DIAGNOSIS — L820 Inflamed seborrheic keratosis: Secondary | ICD-10-CM | POA: Diagnosis not present

## 2020-03-18 DIAGNOSIS — L814 Other melanin hyperpigmentation: Secondary | ICD-10-CM

## 2020-03-18 DIAGNOSIS — L570 Actinic keratosis: Secondary | ICD-10-CM

## 2020-03-18 DIAGNOSIS — D1800 Hemangioma unspecified site: Secondary | ICD-10-CM

## 2020-03-18 DIAGNOSIS — L719 Rosacea, unspecified: Secondary | ICD-10-CM

## 2020-03-18 DIAGNOSIS — D229 Melanocytic nevi, unspecified: Secondary | ICD-10-CM

## 2020-03-18 DIAGNOSIS — L821 Other seborrheic keratosis: Secondary | ICD-10-CM

## 2020-03-18 DIAGNOSIS — Q825 Congenital non-neoplastic nevus: Secondary | ICD-10-CM | POA: Diagnosis not present

## 2020-03-18 DIAGNOSIS — L578 Other skin changes due to chronic exposure to nonionizing radiation: Secondary | ICD-10-CM

## 2020-03-18 DIAGNOSIS — Z1283 Encounter for screening for malignant neoplasm of skin: Secondary | ICD-10-CM

## 2020-03-18 MED ORDER — DOXYCYCLINE HYCLATE 50 MG PO CAPS: 50.0000 mg | ORAL_CAPSULE | Freq: Every day | ORAL | 11 refills | Status: DC

## 2020-03-18 NOTE — Progress Notes (Signed)
Follow-Up Visit   Subjective  Isaac Gould is a 84 y.o. male who presents for the following: Lesion (L forehead - has been there for years but is now growing larger), lesion (L cheek- new lesion that has been there for a few months now), and Annual Exam (patient has noticed the two lesions on his L forehead and L cheek).  The following portions of the chart were reviewed this encounter and updated as appropriate:  Tobacco  Allergies  Meds  Problems  Med Hx  Surg Hx  Fam Hx     Review of Systems:  No other skin or systemic complaints except as noted in HPI or Assessment and Plan.  Objective  Well appearing patient in no apparent distress; mood and affect are within normal limits.  A full examination was performed including scalp, head, eyes, ears, nose, lips, neck, chest, axillae, abdomen, back, buttocks, bilateral upper extremities, bilateral lower extremities, hands, feet, fingers, toes, fingernails, and toenails. All findings within normal limits unless otherwise noted below.  Objective  Face x 6 (6): Erythematous thin papules/macules with gritty scale.   Objective  Face and chest x 4 (4): Erythematous keratotic or waxy stuck-on papule or plaque.   Objective  Head - Anterior (Face): Pinkness and telangiectasias on the cheeks and nose  Objective  L back: Vascular macule  Objective  B/L lower leg: Erythematous, scaly patches involving the ankle and distal lower leg with associated lower leg edema.    Assessment & Plan  AK (actinic keratosis) (6) Face x 6  Destruction of lesion - Face x 6 Complexity: simple   Destruction method: cryotherapy   Informed consent: discussed and consent obtained   Timeout:  patient name, date of birth, surgical site, and procedure verified Lesion destroyed using liquid nitrogen: Yes   Region frozen until ice ball extended beyond lesion: Yes   Outcome: patient tolerated procedure well with no complications   Post-procedure  details: wound care instructions given    History of dysplastic nevus R mid lat tricep  Clear. Observe for recurrence. Call clinic for new or changing lesions.  Recommend regular skin exams, daily broad-spectrum spf 30+ sunscreen use, and photoprotection.     Inflamed seborrheic keratosis (4) Face and chest x 4  Destruction of lesion - Face and chest x 4 Complexity: simple   Destruction method: cryotherapy   Informed consent: discussed and consent obtained   Timeout:  patient name, date of birth, surgical site, and procedure verified Lesion destroyed using liquid nitrogen: Yes   Region frozen until ice ball extended beyond lesion: Yes   Outcome: patient tolerated procedure well with no complications   Post-procedure details: wound care instructions given    Rosacea Head - Anterior (Face)  Continue Skin Medicinals rosacea compound daily, and Doxycycline 50mg  po QD. Doxycycline should be taken with food to prevent nausea. Do not lay down for 30 minutes after taking. Be cautious with sun exposure and use good sun protection while on this medication.   doxycycline (VIBRAMYCIN) 50 MG capsule - Head - Anterior (Face)  Vascular birthmark L back  Benign, observe.    Venous stasis dermatitis of left lower extremity B/L lower leg  Benign, observe.    Actinic Damage - diffuse scaly erythematous macules with underlying dyspigmentation - Recommend daily broad spectrum sunscreen SPF 30+ to sun-exposed areas, reapply every 2 hours as needed.  - Call for new or changing lesions.  Lentigines - Scattered tan macules - Discussed due to sun exposure -  Benign, observe - Call for any changes  Hemangiomas - Red papules - Discussed benign nature - Observe - Call for any changes  Melanocytic Nevi - Tan-brown and/or pink-flesh-colored symmetric macules and papules - Benign appearing on exam today - Observation - Call clinic for new or changing moles - Recommend daily use of broad  spectrum spf 30+ sunscreen to sun-exposed areas.   Seborrheic Keratoses - Stuck-on, waxy, tan-brown papules and plaques  - Discussed benign etiology and prognosis. - Observe - Call for any changes  Skin cancer screening performed today.   Return in about 1 year (around 03/18/2021) for TBSE.  Luther Redo, CMA, am acting as scribe for Sarina Ser, MD .   Documentation: I have reviewed the above documentation for accuracy and completeness, and I agree with the above.  Sarina Ser, MD

## 2021-03-23 ENCOUNTER — Encounter: Payer: Self-pay | Admitting: Dermatology

## 2021-03-23 ENCOUNTER — Ambulatory Visit (INDEPENDENT_AMBULATORY_CARE_PROVIDER_SITE_OTHER): Payer: Medicare Other | Admitting: Dermatology

## 2021-03-23 ENCOUNTER — Other Ambulatory Visit: Payer: Self-pay

## 2021-03-23 DIAGNOSIS — D229 Melanocytic nevi, unspecified: Secondary | ICD-10-CM

## 2021-03-23 DIAGNOSIS — L719 Rosacea, unspecified: Secondary | ICD-10-CM | POA: Diagnosis not present

## 2021-03-23 DIAGNOSIS — Z1283 Encounter for screening for malignant neoplasm of skin: Secondary | ICD-10-CM

## 2021-03-23 DIAGNOSIS — Z86018 Personal history of other benign neoplasm: Secondary | ICD-10-CM

## 2021-03-23 DIAGNOSIS — Q825 Congenital non-neoplastic nevus: Secondary | ICD-10-CM

## 2021-03-23 DIAGNOSIS — L821 Other seborrheic keratosis: Secondary | ICD-10-CM

## 2021-03-23 DIAGNOSIS — L570 Actinic keratosis: Secondary | ICD-10-CM | POA: Diagnosis not present

## 2021-03-23 DIAGNOSIS — L820 Inflamed seborrheic keratosis: Secondary | ICD-10-CM

## 2021-03-23 DIAGNOSIS — I999 Unspecified disorder of circulatory system: Secondary | ICD-10-CM

## 2021-03-23 DIAGNOSIS — D1800 Hemangioma unspecified site: Secondary | ICD-10-CM

## 2021-03-23 DIAGNOSIS — I8393 Asymptomatic varicose veins of bilateral lower extremities: Secondary | ICD-10-CM

## 2021-03-23 DIAGNOSIS — Z872 Personal history of diseases of the skin and subcutaneous tissue: Secondary | ICD-10-CM

## 2021-03-23 DIAGNOSIS — L578 Other skin changes due to chronic exposure to nonionizing radiation: Secondary | ICD-10-CM

## 2021-03-23 DIAGNOSIS — L814 Other melanin hyperpigmentation: Secondary | ICD-10-CM

## 2021-03-23 MED ORDER — DOXYCYCLINE HYCLATE 50 MG PO CAPS: 50.0000 mg | ORAL_CAPSULE | Freq: Every day | ORAL | 3 refills | Status: AC

## 2021-03-23 NOTE — Patient Instructions (Signed)
If you have any questions or concerns for your doctor, please call our main line at [phone] and press option 4 to reach your doctor's medical assistant. If no one answers, please leave a voicemail as directed and we will return your call as soon as possible. Messages left after 4 pm will be answered the following business day.   You may also send us a message via MyChart. We typically respond to MyChart messages within 1-2 business days.  For prescription refills, please ask your pharmacy to contact our office. Our fax number is [phone].  If you have an urgent issue when the clinic is closed that cannot wait until the next business day, you can page your doctor at the number below.    Please note that while we do our best to be available for urgent issues outside of office hours, we are not available 24/7.   If you have an urgent issue and are unable to reach us, you may choose to seek medical care at your doctor's office, retail clinic, urgent care center, or emergency room.  If you have a medical emergency, please immediately call 911 or go to the emergency department.  Pager Numbers  - Dr. Kowalski: [phone]  - Dr. Moye: [phone]  - Dr. Stewart: [phone]  In the event of inclement weather, please call our main line at [phone] for an update on the status of any delays or closures.  Dermatology Medication Tips: Please keep the boxes that topical medications come in in order to help keep track of the instructions about where and how to use these. Pharmacies typically print the medication instructions only on the boxes and not directly on the medication tubes.   If your medication is too expensive, please contact our office at [phone] option 4 or send us a message through MyChart.   We are unable to tell what your co-pay for medications will be in advance as this is different depending on your insurance coverage. However, we may be able to find a  substitute medication at lower cost or fill out paperwork to get insurance to cover a needed medication.   If a prior authorization is required to get your medication covered by your insurance company, please allow us 1-2 business days to complete this process.  Drug prices often vary depending on where the prescription is filled and some pharmacies may offer cheaper prices.  The website www.goodrx.com contains coupons for medications through different pharmacies. The prices here do not account for what the cost may be with help from insurance (it may be cheaper with your insurance), but the website can give you the price if you did not use any insurance.  - You can print the associated coupon and take it with your prescription to the pharmacy.  - You may also stop by our office during regular business hours and pick up a GoodRx coupon card.  - If you need your prescription sent electronically to a different pharmacy, notify our office through [place] MyChart or by phone at [phone] option 4.     Wound Care Instructions  1. Cleanse wound gently with soap and water once a day then pat dry with clean gauze. Apply a thing coat of Petrolatum (petroleum jelly, "Vaseline") over the wound (unless you have an allergy to this). We recommend that you use a new, sterile tube of Vaseline. Do not pick or remove scabs. Do not remove the yellow or white "healing tissue" from the base of the wound.    2. Cover the wound with fresh, clean, nonstick gauze and secure with paper tape. You may use Band-Aids in place of gauze and tape if the would is small enough, but would recommend trimming much of the tape off as there is often too much. Sometimes Band-Aids can irritate the skin.  3. You should call the office for your biopsy report after 1 week if you have not already been contacted.  4. If you experience any problems, such as abnormal amounts of bleeding, swelling, significant bruising, significant pain,  or evidence of infection, please call the office immediately.  5. FOR ADULT SURGERY PATIENTS: If you need something for pain relief you may take 1 extra strength Tylenol (acetaminophen) AND 2 Ibuprofen (200mg each) together every 4 hours as needed for pain. (do not take these if you are allergic to them or if you have a reason you should not take them.) Typically, you may only need pain medication for 1 to 3 days.     

## 2021-03-23 NOTE — Progress Notes (Signed)
Follow-Up Visit   Subjective  Isaac Gould is a 85 y.o. male who presents for the following: Annual Exam (Hx of dysplastic nevi, Ak's, ISK's. Patient still using Doxycycline 50mg  po QD and [facility] Triple mix QD. ).  The following portions of the chart were reviewed this encounter and updated as appropriate:   Tobacco  Allergies  Meds  Problems  Med Hx  Surg Hx  Fam Hx     Review of Systems:  No other skin or systemic complaints except as noted in HPI or Assessment and Plan.  Objective  Well appearing patient in no apparent distress; mood and affect are within normal limits.  A full examination was performed including scalp, head, eyes, ears, nose, lips, neck, chest, axillae, abdomen, back, buttocks, bilateral upper extremities, bilateral lower extremities, hands, feet, fingers, toes, fingernails, and toenails. All findings within normal limits unless otherwise noted below.  Objective  Face: Clear.   Objective  R forehead x 1: Erythematous thin papules/macules with gritty scale.   Objective  L post auricular x 2, R forehead x 1, R post auricular x 1 (4): Erythematous keratotic or waxy stuck-on papule or plaque.   Objective  L post waistline: Vascular macule.  Assessment & Plan  Rosacea Face  Rosacea is a chronic progressive skin condition usually affecting the face of adults, causing redness and/or acne bumps. It is treatable but not curable. It sometimes affects the eyes (ocular rosacea) as well. It may respond to topical and/or systemic medication and can flare with stress, sun exposure, alcohol, exercise and some foods.  Daily application of broad spectrum spf 30+ sunscreen to face is recommended to reduce flares.  Continue Doxycycline 50mg  po QD. Doxycycline should be taken with food to prevent nausea. Do not lay down for 30 minutes after taking. Be cautious with sun exposure and use good sun protection while on this medication. Pregnant women should not  take this medication.   Continue [facility] triple mix QD.   Reordered Medications doxycycline (VIBRAMYCIN) 50 MG capsule  AK (actinic keratosis) R forehead x 1  Destruction of lesion - R forehead x 1 Complexity: simple   Destruction method: cryotherapy   Informed consent: discussed and consent obtained   Timeout:  patient name, date of birth, surgical site, and procedure verified Lesion destroyed using liquid nitrogen: Yes   Region frozen until ice ball extended beyond lesion: Yes   Outcome: patient tolerated procedure well with no complications   Post-procedure details: wound care instructions given    Inflamed seborrheic keratosis (4) L post auricular x 2, R forehead x 1, R post auricular x 1  Destruction of lesion - L post auricular x 2, R forehead x 1, R post auricular x 1 Complexity: simple   Destruction method: cryotherapy   Informed consent: discussed and consent obtained   Timeout:  patient name, date of birth, surgical site, and procedure verified Lesion destroyed using liquid nitrogen: Yes   Region frozen until ice ball extended beyond lesion: Yes   Outcome: patient tolerated procedure well with no complications   Post-procedure details: wound care instructions given    Vascular lesion L post waistline  Vascular birth mark - Benign-appearing.  Observation.  Call clinic for new or changing lesions.  Recommend daily use of broad spectrum spf 30+ sunscreen to sun-exposed areas.    Skin cancer screening  Lentigines - Scattered tan macules - Due to sun exposure - Benign-appering, observe - Recommend daily broad spectrum sunscreen SPF  30+ to sun-exposed areas, reapply every 2 hours as needed. - Call for any changes  Seborrheic Keratoses - Stuck-on, waxy, tan-brown papules and/or plaques  - Benign-appearing - Discussed benign etiology and prognosis. - Observe - Call for any changes  Melanocytic Nevi - Tan-brown and/or pink-flesh-colored symmetric  macules and papules - Benign appearing on exam today - Observation - Call clinic for new or changing moles - Recommend daily use of broad spectrum spf 30+ sunscreen to sun-exposed areas.   Hemangiomas - Red papules - Discussed benign nature - Observe - Call for any changes  Actinic Damage - Chronic condition, secondary to cumulative UV/sun exposure - diffuse scaly erythematous macules with underlying dyspigmentation - Recommend daily broad spectrum sunscreen SPF 30+ to sun-exposed areas, reapply every 2 hours as needed.  - Staying in the shade or wearing long sleeves, sun glasses (UVA+UVB protection) and wide brim hats (4-inch brim around the entire circumference of the hat) are also recommended for sun protection.  - Call for new or changing lesions.  History of Dysplastic Nevus - R mid lat tricep (2014) - No evidence of recurrence today - Recommend regular full body skin exams - Recommend daily broad spectrum sunscreen SPF 30+ to sun-exposed areas, reapply every 2 hours as needed.  - Call if any new or changing lesions are noted between office visits  Varicose Veins - Dilated blue, purple or red veins at the lower extremities - Reassured - These can be treated by sclerotherapy (a procedure to inject a medicine into the veins to make them disappear) if desired, but the treatment is not covered by insurance  Skin cancer screening performed today.  Return in about 1 year (around 03/23/2022) for TBSE - hx dysplastic nevus, AK's, ISK's, rosacea .  Luther Redo, CMA, am acting as scribe for Sarina Ser, MD .  Documentation: I have reviewed the above documentation for accuracy and completeness, and I agree with the above.  Sarina Ser, MD

## 2021-03-24 ENCOUNTER — Encounter: Payer: Self-pay | Admitting: Dermatology

## 2022-03-17 ENCOUNTER — Ambulatory Visit (INDEPENDENT_AMBULATORY_CARE_PROVIDER_SITE_OTHER): Payer: Medicare Other | Admitting: Dermatology

## 2022-03-17 DIAGNOSIS — L820 Inflamed seborrheic keratosis: Secondary | ICD-10-CM

## 2022-03-17 DIAGNOSIS — D229 Melanocytic nevi, unspecified: Secondary | ICD-10-CM

## 2022-03-17 DIAGNOSIS — C4491 Basal cell carcinoma of skin, unspecified: Secondary | ICD-10-CM

## 2022-03-17 DIAGNOSIS — D485 Neoplasm of uncertain behavior of skin: Secondary | ICD-10-CM

## 2022-03-17 DIAGNOSIS — Z1283 Encounter for screening for malignant neoplasm of skin: Secondary | ICD-10-CM

## 2022-03-17 DIAGNOSIS — D489 Neoplasm of uncertain behavior, unspecified: Secondary | ICD-10-CM

## 2022-03-17 DIAGNOSIS — L821 Other seborrheic keratosis: Secondary | ICD-10-CM

## 2022-03-17 DIAGNOSIS — I999 Unspecified disorder of circulatory system: Secondary | ICD-10-CM | POA: Diagnosis not present

## 2022-03-17 DIAGNOSIS — Q825 Congenital non-neoplastic nevus: Secondary | ICD-10-CM

## 2022-03-17 DIAGNOSIS — L814 Other melanin hyperpigmentation: Secondary | ICD-10-CM

## 2022-03-17 DIAGNOSIS — D1800 Hemangioma unspecified site: Secondary | ICD-10-CM

## 2022-03-17 DIAGNOSIS — L719 Rosacea, unspecified: Secondary | ICD-10-CM | POA: Diagnosis not present

## 2022-03-17 DIAGNOSIS — L578 Other skin changes due to chronic exposure to nonionizing radiation: Secondary | ICD-10-CM | POA: Diagnosis not present

## 2022-03-17 DIAGNOSIS — C44519 Basal cell carcinoma of skin of other part of trunk: Secondary | ICD-10-CM

## 2022-03-17 DIAGNOSIS — Z86018 Personal history of other benign neoplasm: Secondary | ICD-10-CM

## 2022-03-17 DIAGNOSIS — L570 Actinic keratosis: Secondary | ICD-10-CM

## 2022-03-17 HISTORY — DX: Basal cell carcinoma of skin, unspecified: C44.91

## 2022-03-17 MED ORDER — DOXYCYCLINE HYCLATE 50 MG PO CAPS: 50.0000 mg | ORAL_CAPSULE | Freq: Every day | ORAL | 6 refills | Status: DC

## 2022-03-17 NOTE — Progress Notes (Signed)
? ?Follow-Up Visit ?  ?Subjective  ?Isaac Gould is a 86 y.o. male who presents for the following: Annual Exam (1 year tbse. Hx of dysplastic nevus, hx of isks, hx of aks,  Hx of rosacea using skin medicinals cream and doxycycline. Would like to discuss refills. /Patient reports a mole that is painful at left waist line and some new spots at face. ). ?The patient presents for Total-Body Skin Exam (TBSE) for skin cancer screening and mole check.  The patient has spots, moles and lesions to be evaluated, some may be new or changing and the patient has concerns that these could be cancer. ? ?The following portions of the chart were reviewed this encounter and updated as appropriate:  Tobacco  Allergies  Meds  Problems  Med Hx  Surg Hx  Fam Hx   ?  ?Review of Systems: No other skin or systemic complaints except as noted in HPI or Assessment and Plan. ? ?Objective  ?Well appearing patient in no apparent distress; mood and affect are within normal limits. ? ?A full examination was performed including scalp, head, eyes, ears, nose, lips, neck, chest, axillae, abdomen, back, buttocks, bilateral upper extremities, bilateral lower extremities, hands, feet, fingers, toes, fingernails, and toenails. All findings within normal limits unless otherwise noted below. ? ?Head - Anterior (Face) ?Minimal pinkness , mild thickening of skin at nose, no bumps  ? ?right hand x 2 (2) ?Erythematous thin papules/macules with gritty scale.  ? ?left posterior waistline ?Vascular macule ? ?right temple x 1 ?Erythematous stuck-on, waxy papule or plaque ? ?left superior chest ?0.7 cm pink papule  ? ?left anterior lateral waistline ?0.8 cm flesh papule  ? ? ? ? ? ? ? ? ? ? ? 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 Condition is symptomatic/ bothersome to patient. Not currently at goal but improving. ?Rosacea is a chronic  progressive skin condition usually affecting the face of adults, causing redness and/or acne bumps. It is treatable but not curable. It sometimes affects the eyes (ocular rosacea) as well. It may respond to topical and/or systemic medication and can flare with stress, sun exposure, alcohol, exercise and some foods.  Daily application of broad spectrum spf 30+ sunscreen to face is recommended to reduce flares. ?Long term medication management.  Patient is using long term (months to years) prescription medication  to control their dermatologic condition.  These medications require periodic monitoring to evaluate for efficacy and side effects and may require periodic laboratory monitoring. ?  ?Continue Doxycycline '50mg'$  po QD.  ?Continue [facility] triple mix QD. Rfs sent to [facility].  ?  ?Doxycycline should be taken with food to prevent nausea. Do not lay down for 30 minutes after taking. Be cautious with sun exposure and use good sun protection while on this medication. Pregnant women should not take this medication.  ?  ?doxycycline (VIBRAMYCIN) 50 MG capsule - Head - Anterior (Face) ?Take 1 capsule (50 mg total) by mouth daily. For rosacea ? ?Actinic keratosis (2) ?right hand x 2 ?Actinic keratoses are precancerous spots that appear secondary to cumulative UV radiation exposure/sun exposure over time. They are chronic with expected duration over 1 year. A portion of actinic keratoses will progress to squamous cell carcinoma of the skin. It is not possible to reliably predict which spots will progress to skin cancer and so treatment is recommended to prevent development of skin  cancer. ? ?Recommend daily broad spectrum sunscreen SPF 30+ to sun-exposed areas, reapply every 2 hours as needed.  ?Recommend staying in the shade or wearing long sleeves, sun glasses (UVA+UVB protection) and wide brim hats (4-inch brim around the entire circumference of the hat). ?Call for new or changing lesions. ? ?Destruction  of lesion - right hand x 2 ?Complexity: simple   ?Destruction method: cryotherapy   ?Informed consent: discussed and consent obtained   ?Timeout:  patient name, date of birth, surgical site, and procedure verified ?Lesion destroyed using liquid nitrogen: Yes   ?Region frozen until ice ball extended beyond lesion: Yes   ?Outcome: patient tolerated procedure well with no complications   ?Post-procedure details: wound care instructions given   ?Additional details:  Prior to procedure, discussed risks of blister formation, small wound, skin dyspigmentation, or rare scar following cryotherapy. Recommend Vaseline ointment to treated areas while healing. ? ?Vascular lesion ?left posterior waistline ?Vascular birth mark - Benign-appearing.  Observation.  Call clinic for new or changing lesions.  Recommend daily use of broad spectrum spf 30+ sunscreen to sun-exposed areas.  ?  ?Inflamed seborrheic keratosis ?right temple x 1 ?Destruction of lesion - right temple x 1 ?Complexity: simple   ?Destruction method: cryotherapy   ?Informed consent: discussed and consent obtained   ?Timeout:  patient name, date of birth, surgical site, and procedure verified ?Lesion destroyed using liquid nitrogen: Yes   ?Region frozen until ice ball extended beyond lesion: Yes   ?Outcome: patient tolerated procedure well with no complications   ?Post-procedure details: wound care instructions given   ? ?Neoplasm of uncertain behavior (2) ?left superior chest ?Epidermal / dermal shaving ? ?Lesion diameter (cm):  0.7 ?Informed consent: discussed and consent obtained   ?Timeout: patient name, date of birth, surgical site, and procedure verified   ?Procedure prep:  Patient was prepped and draped in usual sterile fashion ?Prep type:  Isopropyl alcohol ?Anesthesia: the lesion was anesthetized in a standard fashion   ?Anesthetic:  1% lidocaine w/ epinephrine 1-100,000 buffered w/ 8.4% NaHCO3 ?Instrument used: flexible razor blade   ?Hemostasis achieved  with: pressure, aluminum chloride and electrodesiccation   ?Outcome: patient tolerated procedure well   ?Post-procedure details: sterile dressing applied and wound care instructions given   ?Dressing type: bandage and petrolatum   ? ?Specimen 1 - Surgical pathology ?Differential Diagnosis: R/o Nevus vs dsyplasia vs bcc  ?Check Margins: No ? ?left anterior lateral waistline ?Epidermal / dermal shaving ? ?Lesion diameter (cm):  0.8 ?Informed consent: discussed and consent obtained   ?Timeout: patient name, date of birth, surgical site, and procedure verified   ?Procedure prep:  Patient was prepped and draped in usual sterile fashion ?Prep type:  Isopropyl alcohol ?Anesthesia: the lesion was anesthetized in a standard fashion   ?Anesthetic:  1% lidocaine w/ epinephrine 1-100,000 buffered w/ 8.4% NaHCO3 ?Instrument used: flexible razor blade   ?Hemostasis achieved with: pressure, aluminum chloride and electrodesiccation   ?Outcome: patient tolerated procedure well   ?Post-procedure details: sterile dressing applied and wound care instructions given   ?Dressing type: bandage and petrolatum   ? ?Specimen 2 - Surgical pathology ?Differential Diagnosis: irritated nevus vs dysplastic  ?Check Margins: No ? ?R/o Nevus vs dysplasia vs bcc at left superior chest ?R/o irritated nevus vs dyslasia at left anterior waistline  ? ?Skin cancer screening ? ?Lentigines ?- Scattered tan macules ?- Due to sun exposure ?- Benign-appearing, observe ?- Recommend daily broad spectrum sunscreen SPF 30+ to sun-exposed areas, reapply every  2 hours as needed. ?- Call for any changes ? ?Seborrheic Keratoses ?- Stuck-on, waxy, tan-brown papules and/or plaques  ?- Benign-appearing ?- Discussed benign etiology and prognosis. ?- Observe ?- Call for any changes ? ?Melanocytic Nevi ?- Tan-brown and/or pink-flesh-colored symmetric macules and papules ?- Benign appearing on exam today ?- Observation ?- Call clinic for new or changing moles ?- Recommend  daily use of broad spectrum spf 30+ sunscreen to sun-exposed areas.  ? ?Hemangiomas ?- Red papules ?- Discussed benign nature ?- Observe ?- Call for any changes ? ?Actinic Damage ?- Chronic condition, secondary to cumula

## 2022-03-17 NOTE — Patient Instructions (Addendum)
?Biopsy Wound Care Instructions ? ?Leave the original bandage on for 24 hours if possible.  If the bandage becomes soaked or soiled before that time, it is OK to remove it and examine the wound.  A small amount of post-operative bleeding is normal.  If excessive bleeding occurs, remove the bandage, place gauze over the site and apply continuous pressure (no peeking) over the area for 30 minutes. If this does not work, please call our clinic as soon as possible or page your doctor if it is after hours.  ? ?Once a day, cleanse the wound with soap and water. It is fine to shower. If a thick crust develops you may use a Q-tip dipped into dilute hydrogen peroxide (mix 1:1 with water) to dissolve it.  Hydrogen peroxide can slow the healing process, so use it only as needed.   ? ?After washing, apply petroleum jelly (Vaseline) or an antibiotic ointment if your doctor prescribed one for you, followed by a bandage.   ? ?For best healing, the wound should be covered with a layer of ointment at all times. If you are not able to keep the area covered with a bandage to hold the ointment in place, this may mean re-applying the ointment several times a day.  Continue this wound care until the wound has healed and is no longer open.  ? ?Itching and mild discomfort is normal during the healing process. However, if you develop pain or severe itching, please call our office.  ? ?If you have any discomfort, you can take Tylenol (acetaminophen) or ibuprofen as directed on the bottle. (Please do not take these if you have an allergy to them or cannot take them for another reason). ? ?Some redness, tenderness and white or yellow material in the wound is normal healing.  If the area becomes very sore and red, or develops a thick yellow-green material (pus), it may be infected; please notify us.   ? ?If you have stitches, return to clinic as directed to have the stitches removed. You will continue wound care for 2-3 days after the stitches  are removed.  ? ?Wound healing continues for up to one year following surgery. It is not unusual to experience pain in the scar from time to time during the interval.  If the pain becomes severe or the scar thickens, you should notify the office.   ? ?A slight amount of redness in a scar is expected for the first six months.  After six months, the redness will fade and the scar will soften and fade.  The color difference becomes less noticeable with time.  If there are any problems, return for a post-op surgery check at your earliest convenience. ? ?To improve the appearance of the scar, you can use silicone scar gel, cream, or sheets (such as Mederma or Serica) every night for up to one year. These are available over the counter (without a prescription). ? ?Please call our office at [phone] for any questions or concerns. ? ? ? ? ?Actinic keratoses are precancerous spots that appear secondary to cumulative UV radiation exposure/sun exposure over time. They are chronic with expected duration over 1 year. A portion of actinic keratoses will progress to squamous cell carcinoma of the skin. It is not possible to reliably predict which spots will progress to skin cancer and so treatment is recommended to prevent development of skin cancer. ? ?Recommend daily broad spectrum sunscreen SPF 30+ to sun-exposed areas, reapply every 2 hours as needed.  ?  Recommend staying in the shade or wearing long sleeves, sun glasses (UVA+UVB protection) and wide brim hats (4-inch brim around the entire circumference of the hat). ?Call for new or changing lesions.  ? ?Cryotherapy Aftercare ? ?Wash gently with soap and water everyday.   ?Apply Vaseline and Band-Aid daily until healed.  ? ? ?Doxycycline should be taken with food to prevent nausea. Do not lay down for 30 minutes after taking. Be cautious with sun exposure and use good sun protection while on this medication. Pregnant women should not take this medication.  ? ?Instructions  for [facility] Medications ? ?One or more of your medications was sent to the [facility] mail order compounding pharmacy. You will receive an email from them and can purchase the medicine through that link. It will then be mailed to your home at the address you confirmed. If for any reason you do not receive an email from them, please check your spam folder. If you still do not find the email, please let us know. [facility] phone number is [phone].  ? ? ?Seborrheic Keratosis ? ?What causes seborrheic keratoses? ?Seborrheic keratoses are harmless, common skin growths that first appear during adult life.  As time goes by, more growths appear.  Some people may develop a large number of them.  Seborrheic keratoses appear on both covered and uncovered body parts.  They are not caused by sunlight.  The tendency to develop seborrheic keratoses can be inherited.  They vary in color from skin-colored to gray, brown, or even black.  They can be either smooth or have a rough, warty surface.   ?Seborrheic keratoses are superficial and look as if they were stuck on the skin.  Under the microscope this type of keratosis looks like layers upon layers of skin.  That is why at times the top layer may seem to fall off, but the rest of the growth remains and re-grows.   ? ?Treatment ?Seborrheic keratoses do not need to be treated, but can easily be removed in the office.  Seborrheic keratoses often cause symptoms when they rub on clothing or jewelry.  Lesions can be in the way of shaving.  If they become inflamed, they can cause itching, soreness, or burning.  Removal of a seborrheic keratosis can be accomplished by freezing, burning, or surgery. ?If any spot bleeds, scabs, or grows rapidly, please return to have it checked, as these can be an indication of a skin cancer. ? ? ? ? ? ? ? ? ?Melanoma ABCDEs ? ?Melanoma is the most dangerous type of skin cancer, and is the leading cause of death from skin disease.  You  are more likely to develop melanoma if you: ?Have light-colored skin, light-colored eyes, or red or blond hair ?Spend a lot of time in the sun ?Tan regularly, either outdoors or in a tanning bed ?Have had blistering sunburns, especially during childhood ?Have a close family member who has had a melanoma ?Have atypical moles or large birthmarks ? ?Early detection of melanoma is key since treatment is typically straightforward and cure rates are extremely high if we catch it early.  ? ?The first sign of melanoma is often a change in a mole or a new dark spot.  The ABCDE system is a way of remembering the signs of melanoma. ? ?A for asymmetry:  The two halves do not match. ?B for border:  The edges of the growth are irregular. ?C for color:  A mixture of colors are  present instead of an even brown color. ?D for diameter:  Melanomas are usually (but not always) greater than 51m - the size of a pencil eraser. ?E for evolution:  The spot keeps changing in size, shape, and color. ? ?Please check your skin once per month between visits. You can use a small mirror in front and a large mirror behind you to keep an eye on the back side or your body.  ? ?If you see any new or changing lesions before your next follow-up, please call to schedule a visit. ? ?Please continue daily skin protection including broad spectrum sunscreen SPF 30+ to sun-exposed areas, reapplying every 2 hours as needed when you're outdoors.  ? ?Staying in the shade or wearing long sleeves, sun glasses (UVA+UVB protection) and wide brim hats (4-inch brim around the entire circumference of the hat) are also recommended for sun protection.   ? ? ?If You Need Anything After Your Visit ? ?If you have any questions or concerns for your doctor, please call our main line at [phone] press option 4 to reach your doctor's medical assistant. If no one answers, please leave a voicemail as directed and we will return your call as soon as possible. Messages left  after 4 pm will be answered the following business day.  ? ?You may also send [facility] message via MyChart. We typically respond to MyChart messages within 1-2 business days. ? ?For prescription refil

## 2022-03-23 ENCOUNTER — Telehealth: Payer: Self-pay

## 2022-03-23 NOTE — Telephone Encounter (Signed)
-----   Message from Ralene Bathe, MD sent at 03/20/2022 11:21 AM EDT ----- ?Diagnosis ?1. Skin , left superior chest ?BASAL CELL CARCINOMA, NODULAR PATTERN, BASE INVOLVED ?2. Skin , left anterior lateral waistline ?[place] ? ?1- Cancer - BCC ?Schedule for treatment [facility]) ?2- benign skin tag ?No further treatment needed ?

## 2022-03-23 NOTE — Telephone Encounter (Signed)
Left message on voicemail to return my call.  

## 2022-03-24 ENCOUNTER — Encounter: Payer: Self-pay | Admitting: Dermatology

## 2022-03-29 ENCOUNTER — Telehealth: Payer: Self-pay

## 2022-03-29 NOTE — Telephone Encounter (Signed)
Pt walked in the office requesting pathology results discussed with pt  ?

## 2022-03-29 NOTE — Telephone Encounter (Signed)
-----   Message from Ralene Bathe, MD sent at 03/20/2022 11:21 AM EDT ----- ?Diagnosis ?1. Skin , left superior chest ?BASAL CELL CARCINOMA, NODULAR PATTERN, BASE INVOLVED ?2. Skin , left anterior lateral waistline ?[place] ? ?1- Cancer - BCC ?Schedule for treatment [facility]) ?2- benign skin tag ?No further treatment needed ?

## 2022-04-14 ENCOUNTER — Ambulatory Visit (INDEPENDENT_AMBULATORY_CARE_PROVIDER_SITE_OTHER): Payer: Medicare Other | Admitting: Dermatology

## 2022-04-14 DIAGNOSIS — C44619 Basal cell carcinoma of skin of left upper limb, including shoulder: Secondary | ICD-10-CM

## 2022-04-14 DIAGNOSIS — C44519 Basal cell carcinoma of skin of other part of trunk: Secondary | ICD-10-CM

## 2022-04-14 NOTE — Progress Notes (Signed)
   Follow-Up Visit   Subjective  Isaac Gould is a 86 y.o. male who presents for the following: Follow-up (Pt here for treatment of biopsy proven BCC on the left superior chest ).  The following portions of the chart were reviewed this encounter and updated as appropriate:   Tobacco  Allergies  Meds  Problems  Med Hx  Surg Hx  Fam Hx     Review of Systems:  No other skin or systemic complaints except as noted in HPI or Assessment and Plan.  Objective  Well appearing patient in no apparent distress; mood and affect are within normal limits.  A focused examination was performed including chest. Relevant physical exam findings are noted in the Assessment and Plan.  left medial chest Pink pearly papule or plaque with arborizing vessels.    Assessment & Plan  Basal cell carcinoma (BCC) - biopsy proven left superior chest  Destruction of lesion  Destruction method: electrodesiccation and curettage   Informed consent: discussed and consent obtained   Timeout:  patient name, date of birth, surgical site, and procedure verified Anesthesia: the lesion was anesthetized in a standard fashion   Anesthetic:  1% lidocaine w/ epinephrine 1-100,000 buffered w/ 8.4% NaHCO3 Curettage performed in three different directions: Yes   Electrodesiccation performed over the curetted area: Yes   Curettage cycles:  3 Lesion length (cm):  1.7 Lesion width (cm):  1.7 Margin per side (cm):  0.2 Final wound size (cm):  2.1 Hemostasis achieved with:  electrodesiccation Outcome: patient tolerated procedure well with no complications   Post-procedure details: sterile dressing applied and wound care instructions given   Dressing type: petrolatum    Return for as scheduled for TBSE .  IMarye Round, CMA, am acting as scribe for Sarina Ser, MD .  Documentation: I have reviewed the above documentation for accuracy and completeness, and I agree with the above.  Sarina Ser, MD

## 2022-04-14 NOTE — Patient Instructions (Signed)

## 2022-04-19 ENCOUNTER — Encounter: Payer: Self-pay | Admitting: Dermatology

## 2022-11-10 ENCOUNTER — Other Ambulatory Visit: Payer: Self-pay | Admitting: Dermatology

## 2022-11-10 DIAGNOSIS — L719 Rosacea, unspecified: Secondary | ICD-10-CM

## 2023-03-23 ENCOUNTER — Ambulatory Visit (INDEPENDENT_AMBULATORY_CARE_PROVIDER_SITE_OTHER): Payer: Medicare Other | Admitting: Dermatology

## 2023-03-23 VITALS — BP 135/75 | HR 64

## 2023-03-23 DIAGNOSIS — Z7189 Other specified counseling: Secondary | ICD-10-CM

## 2023-03-23 DIAGNOSIS — L821 Other seborrheic keratosis: Secondary | ICD-10-CM

## 2023-03-23 DIAGNOSIS — L820 Inflamed seborrheic keratosis: Secondary | ICD-10-CM | POA: Diagnosis not present

## 2023-03-23 DIAGNOSIS — L570 Actinic keratosis: Secondary | ICD-10-CM | POA: Diagnosis not present

## 2023-03-23 DIAGNOSIS — Z1283 Encounter for screening for malignant neoplasm of skin: Secondary | ICD-10-CM

## 2023-03-23 DIAGNOSIS — D1801 Hemangioma of skin and subcutaneous tissue: Secondary | ICD-10-CM

## 2023-03-23 DIAGNOSIS — L719 Rosacea, unspecified: Secondary | ICD-10-CM

## 2023-03-23 DIAGNOSIS — L578 Other skin changes due to chronic exposure to nonionizing radiation: Secondary | ICD-10-CM

## 2023-03-23 DIAGNOSIS — Z85828 Personal history of other malignant neoplasm of skin: Secondary | ICD-10-CM

## 2023-03-23 DIAGNOSIS — Z86018 Personal history of other benign neoplasm: Secondary | ICD-10-CM

## 2023-03-23 DIAGNOSIS — Z79899 Other long term (current) drug therapy: Secondary | ICD-10-CM

## 2023-03-23 DIAGNOSIS — L814 Other melanin hyperpigmentation: Secondary | ICD-10-CM

## 2023-03-23 DIAGNOSIS — X32XXXA Exposure to sunlight, initial encounter: Secondary | ICD-10-CM

## 2023-03-23 DIAGNOSIS — I781 Nevus, non-neoplastic: Secondary | ICD-10-CM

## 2023-03-23 DIAGNOSIS — W908XXA Exposure to other nonionizing radiation, initial encounter: Secondary | ICD-10-CM

## 2023-03-23 DIAGNOSIS — L603 Nail dystrophy: Secondary | ICD-10-CM

## 2023-03-23 MED ORDER — DOXYCYCLINE HYCLATE 50 MG PO CAPS
ORAL_CAPSULE | ORAL | 6 refills | Status: DC
Start: 2023-03-23 — End: 2024-01-16

## 2023-03-23 NOTE — Progress Notes (Signed)
Follow-Up Visit   Subjective  Isaac Gould is a 87 y.o. male who presents for the following: Skin Cancer Screening and Full Body Skin Exam Hx of rosacea, hx of bcc, hx of dysplastic nevi. Reports spot at right ear, neck, under right eye, and left cheek area.  The patient presents for Total-Body Skin Exam (TBSE) for skin cancer screening and mole check. The patient has spots, moles and lesions to be evaluated, some may be new or changing and the patient has concerns that these could be cancer.  The following portions of the chart were reviewed this encounter and updated as appropriate: medications, allergies, medical history  Review of Systems:  No other skin or systemic complaints except as noted in HPI or Assessment and Plan.  Objective  Well appearing patient in no apparent distress; mood and affect are within normal limits.  A full examination was performed including scalp, head, eyes, ears, nose, lips, neck, chest, axillae, abdomen, back, buttocks, bilateral upper extremities, bilateral lower extremities, hands, feet, fingers, toes, fingernails, and toenails. All findings within normal limits unless otherwise noted below.   Relevant physical exam findings are noted in the Assessment and Plan.  Right Ear x 1, left neck x 2 (3) Erythematous thin papules/macules with gritty scale.   right neck x 2, (2) Erythematous stuck-on, waxy papule or plaque   Assessment & Plan    Actinic keratosis (3) Right Ear x 1, left neck x 2  Actinic keratoses are precancerous spots that appear secondary to cumulative UV radiation exposure/sun exposure over time. They are chronic with expected duration over 1 year. A portion of actinic keratoses will progress to squamous cell carcinoma of the skin. It is not possible to reliably predict which spots will progress to skin cancer and so treatment is recommended to prevent development of skin cancer.  Recommend daily broad spectrum sunscreen SPF 30+ to  sun-exposed areas, reapply every 2 hours as needed.  Recommend staying in the shade or wearing long sleeves, sun glasses (UVA+UVB protection) and wide brim hats (4-inch brim around the entire circumference of the hat). Call for new or changing lesions.  Destruction of lesion - Right Ear x 1, left neck x 2 Complexity: simple   Destruction method: cryotherapy   Informed consent: discussed and consent obtained   Timeout:  patient name, date of birth, surgical site, and procedure verified Lesion destroyed using liquid nitrogen: Yes   Region frozen until ice ball extended beyond lesion: Yes   Outcome: patient tolerated procedure well with no complications   Post-procedure details: wound care instructions given    Rosacea  Related Medications doxycycline (VIBRAMYCIN) 50 MG capsule TAKE ONE CAPSULE BY MOUTH DAILY FOR ROSACEA  Inflamed seborrheic keratosis (2) right neck x 2,  Symptomatic, irritating, patient would like treated.  Destruction of lesion - right neck x 2, Complexity: simple   Destruction method: cryotherapy   Informed consent: discussed and consent obtained   Timeout:  patient name, date of birth, surgical site, and procedure verified Lesion destroyed using liquid nitrogen: Yes   Region frozen until ice ball extended beyond lesion: Yes   Outcome: patient tolerated procedure well with no complications   Post-procedure details: wound care instructions given    Skin cancer screening  Seborrheic keratosis  Actinic skin damage  Counseling and coordination of care  Medication management  History of basal cell carcinoma  History of dysplastic nevus   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 Condition is symptomatic/ bothersome to patient. Not currently at goal but improving. Rosacea is a chronic progressive skin condition usually affecting  the face of adults, causing redness and/or acne bumps. It is treatable but not curable. It sometimes affects the eyes (ocular rosacea) as well. It may respond to topical and/or systemic medication and can flare with stress, sun exposure, alcohol, exercise and some foods.  Daily application of broad spectrum spf 30+ sunscreen to face is recommended to reduce flares. Long term medication management.  Patient is using long term (months to years) prescription medication  to control their dermatologic condition.  These medications require periodic monitoring to evaluate for efficacy and side effects and may require periodic laboratory monitoring.   Continue Doxycycline 50mg  po QD.  Continue [facility] triple mix QD. Rfs sent to [facility].    Doxycycline should be taken with food to prevent nausea. Do not lay down for 30 minutes after taking. Be cautious with sun exposure and use good sun protection while on this medication. Pregnant women should not take this medication.   LENTIGINES, SEBORRHEIC KERATOSES, HEMANGIOMAS - Benign normal skin lesions - Benign-appearing - Call for any changes  MELANOCYTIC NEVI - Tan-brown and/or pink-flesh-colored symmetric macules and papules - Benign appearing on exam today - Observation - Call clinic for new or changing moles - Recommend daily use of broad spectrum spf 30+ sunscreen to sun-exposed areas.   Vascular lesion left posterior waistline Vascular birth mark - Benign-appearing.  Observation.  Call clinic for new or changing lesions.  Recommend daily use of broad spectrum spf 30+ sunscreen to sun-exposed areas.   NAIL PROBLEM Exam: dystrophic nail at feet  Treatment Plan: History of trauma No treatment recommended Continue to Observe.   ACTINIC DAMAGE - Chronic condition, secondary to cumulative UV/sun exposure - diffuse scaly erythematous macules with underlying dyspigmentation - Recommend daily broad spectrum sunscreen SPF 30+ to  sun-exposed areas, reapply every 2 hours as needed.  - Staying in the shade or wearing long sleeves, sun glasses (UVA+UVB protection) and wide brim hats (4-inch brim around the entire circumference of the hat) are also recommended for sun protection.  - Call for new or changing lesions.  HISTORY OF BASAL CELL CARCINOMA OF THE SKIN Left superior chest [facility] 04/16/22 - No evidence of recurrence today - Recommend regular full body skin exams - Recommend daily broad spectrum sunscreen SPF 30+ to sun-exposed areas, reapply every 2 hours as needed.  - Call if any new or changing lesions are noted between office visits  HISTORY OF DYSPLASTIC NEVUS Right mid lat tricep 05/2013 No evidence of recurrence today Recommend regular full body skin exams Recommend daily broad spectrum sunscreen SPF 30+ to sun-exposed areas, reapply every 2 hours as needed.  Call if any new or changing lesions are noted between office visits  SKIN CANCER SCREENING PERFORMED TODAY.  Return in about 1 year (around 03/22/2024) for TBSE.  IAsher Muir, CMA, am acting as scribe for Armida Sans, MD.  Documentation: I have reviewed the above documentation for accuracy and completeness, and I agree with the above.  Armida Sans, MD

## 2023-03-23 NOTE — Patient Instructions (Addendum)
Actinic keratoses are precancerous spots that appear secondary to cumulative UV radiation exposure/sun exposure over time. They are chronic with expected duration over 1 year. A portion of actinic keratoses will progress to squamous cell carcinoma of the skin. It is not possible to reliably predict which spots will progress to skin cancer and so treatment is recommended to prevent development of skin cancer.  Recommend daily broad spectrum sunscreen SPF 30+ to sun-exposed areas, reapply every 2 hours as needed.  Recommend staying in the shade or wearing long sleeves, sun glasses (UVA+UVB protection) and wide brim hats (4-inch brim around the entire circumference of the hat). Call for new or changing lesions.   Cryotherapy Aftercare  Wash gently with soap and water everyday.   Apply Vaseline and Band-Aid daily until healed.    Seborrheic Keratosis  What causes seborrheic keratoses? Seborrheic keratoses are harmless, common skin growths that first appear during adult life.  As time goes by, more growths appear.  Some people may develop a large number of them.  Seborrheic keratoses appear on both covered and uncovered body parts.  They are not caused by sunlight.  The tendency to develop seborrheic keratoses can be inherited.  They vary in color from skin-colored to gray, brown, or even black.  They can be either smooth or have a rough, warty surface.   Seborrheic keratoses are superficial and look as if they were stuck on the skin.  Under the microscope this type of keratosis looks like layers upon layers of skin.  That is why at times the top layer may seem to fall off, but the rest of the growth remains and re-grows.    Treatment Seborrheic keratoses do not need to be treated, but can easily be removed in the office.  Seborrheic keratoses often cause symptoms when they rub on clothing or jewelry.  Lesions can be in the way of shaving.  If they become inflamed, they can cause itching, soreness, or  burning.  Removal of a seborrheic keratosis can be accomplished by freezing, burning, or surgery. If any spot bleeds, scabs, or grows rapidly, please return to have it checked, as these can be an indication of a skin cancer.  Melanoma ABCDEs  Melanoma is the most dangerous type of skin cancer, and is the leading cause of death from skin disease.  You are more likely to develop melanoma if you: Have light-colored skin, light-colored eyes, or red or blond hair Spend a lot of time in the sun Tan regularly, either outdoors or in a tanning bed Have had blistering sunburns, especially during childhood Have a close family member who has had a melanoma Have atypical moles or large birthmarks  Early detection of melanoma is key since treatment is typically straightforward and cure rates are extremely high if we catch it early.   The first sign of melanoma is often a change in a mole or a new dark spot.  The ABCDE system is a way of remembering the signs of melanoma.  A for asymmetry:  The two halves do not match. B for border:  The edges of the growth are irregular. C for color:  A mixture of colors are present instead of an even brown color. D for diameter:  Melanomas are usually (but not always) greater than 6mm - the size of a pencil eraser. E for evolution:  The spot keeps changing in size, shape, and color.  Please check your skin once per month between visits. You can use a small   mirror in front and a large mirror behind you to keep an eye on the back side or your body.   If you see any new or changing lesions before your next follow-up, please call to schedule a visit.  Please continue daily skin protection including broad spectrum sunscreen SPF 30+ to sun-exposed areas, reapplying every 2 hours as needed when you're outdoors.   Staying in the shade or wearing long sleeves, sun glasses (UVA+UVB protection) and wide brim hats (4-inch brim around the entire circumference of the hat) are also  recommended for sun protection.    Due to recent changes in healthcare laws, you may see results of your pathology and/or laboratory studies on MyChart before the doctors have had a chance to review them. We understand that in some cases there may be results that are confusing or concerning to you. Please understand that not all results are received at the same time and often the doctors may need to interpret multiple results in order to provide you with the best plan of care or course of treatment. Therefore, we ask that you please give us 2 business days to thoroughly review all your results before contacting the office for clarification. Should we see a critical lab result, you will be contacted sooner.   If You Need Anything After Your Visit  If you have any questions or concerns for your doctor, please call our main line at [phone] and press option 4 to reach your doctor's medical assistant. If no one answers, please leave a voicemail as directed and we will return your call as soon as possible. Messages left after 4 pm will be answered the following business day.   You may also send us a message via MyChart. We typically respond to MyChart messages within 1-2 business days.  For prescription refills, please ask your pharmacy to contact our office. Our fax number is [phone].  If you have an urgent issue when the clinic is closed that cannot wait until the next business day, you can page your doctor at the number below.    Please note that while we do our best to be available for urgent issues outside of office hours, we are not available 24/7.   If you have an urgent issue and are unable to reach us, you may choose to seek medical care at your doctor's office, retail clinic, urgent care center, or emergency room.  If you have a medical emergency, please immediately call 911 or go to the emergency department.  Pager Numbers  - Dr. Kowalski: [phone]  - Dr. Moye:  [phone]  - Dr. Stewart: [phone]  In the event of inclement weather, please call our main line at [phone] for an update on the status of any delays or closures.  Dermatology Medication Tips: Please keep the boxes that topical medications come in in order to help keep track of the instructions about where and how to use these. Pharmacies typically print the medication instructions only on the boxes and not directly on the medication tubes.   If your medication is too expensive, please contact our office at [phone] option 4 or send us a message through MyChart.   We are unable to tell what your co-pay for medications will be in advance as this is different depending on your insurance coverage. However, we may be able to find a substitute medication at lower cost or fill out paperwork to get insurance to cover a needed medication.   If a prior authorization   is required to get your medication covered by your insurance company, please allow us 1-2 business days to complete this process.  Drug prices often vary depending on where the prescription is filled and some pharmacies may offer cheaper prices.  The website www.goodrx.com contains coupons for medications through different pharmacies. The prices here do not account for what the cost may be with help from insurance (it may be cheaper with your insurance), but the website can give you the price if you did not use any insurance.  - You can print the associated coupon and take it with your prescription to the pharmacy.  - You may also stop by our office during regular business hours and pick up a GoodRx coupon card.  - If you need your prescription sent electronically to a different pharmacy, notify our office through [facility] MyChart or by phone at [phone] option 4.     Si Usted Necesita Algo Despus de Su Visita  Tambin puede enviarnos un mensaje a travs de MyChart. Por lo general respondemos a los mensajes de  MyChart en el transcurso de 1 a 2 das hbiles.  Para renovar recetas, por favor pida a su farmacia que se ponga en contacto con nuestra oficina. Nuestro nmero de fax es el [phone].  Si tiene un asunto urgente cuando la clnica est cerrada y que no puede esperar hasta el siguiente da hbil, puede llamar/localizar a su doctor(a) al nmero que aparece a continuacin.   Por favor, tenga en cuenta que aunque hacemos todo lo posible para estar disponibles para asuntos urgentes fuera del horario de oficina, no estamos disponibles las 24 horas del da, los 7 das de la semana.   Si tiene un problema urgente y no puede comunicarse con nosotros, puede optar por buscar atencin mdica  en el consultorio de su doctor(a), en una clnica privada, en un centro de atencin urgente o en una sala de emergencias.  Si tiene una emergencia mdica, por favor llame inmediatamente al 911 o vaya a la sala de emergencias.  Nmeros de bper  - Dr. Kowalski: [phone]  - Dra. Moye: [phone]  - Dra. Stewart: [phone]  En caso de inclemencias del tiempo, por favor llame a nuestra lnea principal al [phone] para una actualizacin sobre el estado de cualquier retraso o cierre.  Consejos para la medicacin en dermatologa: Por favor, guarde las cajas en las que vienen los medicamentos de uso tpico para ayudarle a seguir las instrucciones sobre dnde y cmo usarlos. Las farmacias generalmente imprimen las instrucciones del medicamento slo en las cajas y no directamente en los tubos del medicamento.   Si su medicamento es muy caro, por favor, pngase en contacto con nuestra oficina llamando al [phone] y presione la opcin 4 o envenos un mensaje a travs de MyChart.   No podemos decirle cul ser su copago por los medicamentos por adelantado ya que esto es diferente dependiendo de la cobertura de su seguro. Sin embargo, es posible que podamos encontrar un medicamento sustituto a menor costo o  llenar un formulario para que el seguro cubra el medicamento que se considera necesario.   Si se requiere una autorizacin previa para que su compaa de seguros cubra su medicamento, por favor permtanos de 1 a 2 das hbiles para completar este proceso.  Los precios de los medicamentos varan con frecuencia dependiendo del lugar de dnde se surte la receta y alguna farmacias pueden ofrecer precios ms baratos.  El sitio web www.goodrx.com tiene cupones para medicamentos   de diferentes farmacias. Los precios aqu no tienen en cuenta lo que podra costar con la ayuda del seguro (puede ser ms barato con su seguro), pero el sitio web puede darle el precio si no utiliz ningn seguro.  - Puede imprimir el cupn correspondiente y llevarlo con su receta a la farmacia.  - Tambin puede pasar por nuestra oficina durante el horario de atencin regular y recoger una tarjeta de cupones de GoodRx.  - Si necesita que su receta se enve electrnicamente a una farmacia diferente, informe a nuestra oficina a travs de MyChart de [facility] o por telfono llamando al [phone] y presione la opcin 4.  

## 2023-03-29 ENCOUNTER — Encounter: Payer: Self-pay | Admitting: Dermatology

## 2024-01-14 ENCOUNTER — Other Ambulatory Visit: Payer: Self-pay | Admitting: Dermatology

## 2024-01-14 DIAGNOSIS — L719 Rosacea, unspecified: Secondary | ICD-10-CM

## 2024-03-22 ENCOUNTER — Ambulatory Visit: Payer: Medicare Other | Admitting: Dermatology

## 2024-03-22 ENCOUNTER — Encounter: Payer: Self-pay | Admitting: Dermatology

## 2024-03-22 DIAGNOSIS — D229 Melanocytic nevi, unspecified: Secondary | ICD-10-CM

## 2024-03-22 DIAGNOSIS — D1801 Hemangioma of skin and subcutaneous tissue: Secondary | ICD-10-CM

## 2024-03-22 DIAGNOSIS — Z1283 Encounter for screening for malignant neoplasm of skin: Secondary | ICD-10-CM | POA: Diagnosis not present

## 2024-03-22 DIAGNOSIS — Z85828 Personal history of other malignant neoplasm of skin: Secondary | ICD-10-CM

## 2024-03-22 DIAGNOSIS — L603 Nail dystrophy: Secondary | ICD-10-CM

## 2024-03-22 DIAGNOSIS — L820 Inflamed seborrheic keratosis: Secondary | ICD-10-CM | POA: Diagnosis not present

## 2024-03-22 DIAGNOSIS — I781 Nevus, non-neoplastic: Secondary | ICD-10-CM | POA: Diagnosis not present

## 2024-03-22 DIAGNOSIS — L814 Other melanin hyperpigmentation: Secondary | ICD-10-CM

## 2024-03-22 DIAGNOSIS — L711 Rhinophyma: Secondary | ICD-10-CM | POA: Diagnosis not present

## 2024-03-22 DIAGNOSIS — Z7189 Other specified counseling: Secondary | ICD-10-CM

## 2024-03-22 DIAGNOSIS — W908XXA Exposure to other nonionizing radiation, initial encounter: Secondary | ICD-10-CM

## 2024-03-22 DIAGNOSIS — Z86018 Personal history of other benign neoplasm: Secondary | ICD-10-CM

## 2024-03-22 DIAGNOSIS — L821 Other seborrheic keratosis: Secondary | ICD-10-CM

## 2024-03-22 DIAGNOSIS — L570 Actinic keratosis: Secondary | ICD-10-CM | POA: Diagnosis not present

## 2024-03-22 DIAGNOSIS — L719 Rosacea, unspecified: Secondary | ICD-10-CM

## 2024-03-22 DIAGNOSIS — Z79899 Other long term (current) drug therapy: Secondary | ICD-10-CM

## 2024-03-22 DIAGNOSIS — L578 Other skin changes due to chronic exposure to nonionizing radiation: Secondary | ICD-10-CM

## 2024-03-22 MED ORDER — DOXYCYCLINE HYCLATE 50 MG PO CAPS: ORAL_CAPSULE | ORAL | 6 refills | Status: AC

## 2024-03-22 NOTE — Progress Notes (Signed)
 Follow-Up Visit   Subjective  Isaac Gould is a 88 y.o. male who presents for the following: Skin Cancer Screening and Full Body Skin Exam, hx of BCC, hx of Dysplastic nevus  The patient presents for Total-Body Skin Exam (TBSE) for skin cancer screening and mole check. The patient has spots, moles and lesions to be evaluated, some may be new or changing and the patient may have concern these could be cancer.  The following portions of the chart were reviewed this encounter and updated as appropriate: medications, allergies, medical history  Review of Systems:  No other skin or systemic complaints except as noted in HPI or Assessment and Plan.  Objective  Well appearing patient in no apparent distress; mood and affect are within normal limits.  A full examination was performed including scalp, head, eyes, ears, nose, lips, neck, chest, axillae, abdomen, back, buttocks, bilateral upper extremities, bilateral lower extremities, hands, feet, fingers, toes, fingernails, and toenails. All findings within normal limits unless otherwise noted below.   Relevant physical exam findings are noted in the Assessment and Plan.  face x 5 (5) Erythematous thin papules/macules with gritty scale.  face, neck x 15, low back spinal x 3 (18) Stuck-on, waxy, tan-brown papules and plaques -- Discussed benign etiology and prognosis.   Assessment & Plan   SKIN CANCER SCREENING PERFORMED TODAY.  LENTIGINES, SEBORRHEIC KERATOSES, HEMANGIOMAS - Benign normal skin lesions - Benign-appearing - Call for any changes  MELANOCYTIC NEVI - Tan-brown and/or pink-flesh-colored symmetric macules and papules - Benign appearing on exam today - Observation - Call clinic for new or changing moles - Recommend daily use of broad spectrum spf 30+ sunscreen to sun-exposed areas.   NAIL PROBLEM Exam: dystrophic nail at feet  Chronic and persistent condition with duration or expected duration over one year. Condition is  symptomatic / bothersome to patient. Not to goal. Treatment Plan: History of trauma No treatment recommended Continue to Observe.   HISTORY OF DYSPLASTIC NEVUS No evidence of recurrence today Recommend regular full body skin exams Recommend daily broad spectrum sunscreen SPF 30+ to sun-exposed areas, reapply every 2 hours as needed.  Call if any new or changing lesions are noted between office visits   HISTORY OF BASAL CELL CARCINOMA OF THE SKIN - No evidence of recurrence today - Recommend regular full body skin exams - Recommend daily broad spectrum sunscreen SPF 30+ to sun-exposed areas, reapply every 2 hours as needed.  - Call if any new or changing lesions are noted between office visits   Varicose Veins/Spider Veins - Dilated blue, purple or red veins at the lower extremities - Reassured - Smaller vessels can be treated by sclerotherapy (a procedure to inject a medicine into the veins to make them disappear) if desired, but the treatment is not covered by insurance. Larger vessels may be covered if symptomatic and we would refer to vascular surgeon if treatment desired.   Vascular lesion left posterior waistline Vascular birth mark - Benign-appearing.  Observation.  Call clinic for new or changing lesions.  Recommend daily use of broad spectrum spf 30+ sunscreen to sun-exposed areas.    AK (ACTINIC KERATOSIS) (5) face x 5 (5) ACTINIC DAMAGE - chronic, secondary to cumulative UV radiation exposure/sun exposure over time - diffuse scaly erythematous macules with underlying dyspigmentation - Recommend daily broad spectrum sunscreen SPF 30+ to sun-exposed areas, reapply every 2 hours as needed.  - Recommend staying in the shade or wearing long sleeves, sun glasses (UVA+UVB protection) and  wide brim hats (4-inch brim around the entire circumference of the hat). - Call for new or changing lesions.  Destruction of lesion - face x 5 (5) Complexity: simple   Destruction method:  cryotherapy   Informed consent: discussed and consent obtained   Timeout:  patient name, date of birth, surgical site, and procedure verified Lesion destroyed using liquid nitrogen: Yes   Region frozen until ice ball extended beyond lesion: Yes   Outcome: patient tolerated procedure well with no complications   Post-procedure details: wound care instructions given   INFLAMED SEBORRHEIC KERATOSIS (18) face, neck x 15, low back spinal x 3 (18) Symptomatic, irritating, patient would like treated.  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 Condition is symptomatic/ bothersome to patient. Not currently at goal but improving. Rosacea is a chronic progressive skin condition usually affecting the face of adults, causing redness and/or acne bumps. It is treatable but not curable. It sometimes affects the eyes (ocular rosacea) as well. It may respond to topical and/or systemic medication and can flare with stress, sun exposure, alcohol, exercise and some foods.  Daily application of broad spectrum spf 30+ sunscreen to face is recommended to reduce flares. Long term medication management.  Patient is using long term (months to years) prescription medication  to control their dermatologic condition.  These medications require periodic monitoring to evaluate for efficacy and side effects and may require  periodic laboratory monitoring.   Continue Doxycycline  50mg  po QD.  Continue [facility] triple mix QD. Rfs sent to [facility].    Doxycycline  should be taken with food to prevent nausea. Do not lay down for 30 minutes after taking. Be cautious with sun exposure and use good sun protection while on this medication. Pregnant women should not take this medication.    Related Medications doxycycline  (VIBRAMYCIN ) 50 MG capsule TAKE 1 CAPSULE BY MOUTH DAILY FOR ROSACEA   Return in about 1 year (around 03/22/2025) for TBSE, hx of BCC, hx of Dysplastic nevus .  IClara Crisp, CMA, am acting as scribe for Celine Collard, MD .   Documentation: I have reviewed the above documentation for accuracy and completeness, and I agree with the above.  Celine Collard, MD

## 2024-03-22 NOTE — Patient Instructions (Addendum)

## 2025-03-28 ENCOUNTER — Ambulatory Visit: Admitting: Dermatology
